# Patient Record
Sex: Male | Born: 1960 | Race: White | Hispanic: No | Smoking: Never smoker
Health system: Southern US, Community
[De-identification: ages and names within clinical notes are randomized; demographics above are authoritative.]

## PROBLEM LIST (undated history)

## (undated) DIAGNOSIS — F329 Major depressive disorder, single episode, unspecified: Secondary | ICD-10-CM

## (undated) DIAGNOSIS — K1230 Oral mucositis (ulcerative), unspecified: Secondary | ICD-10-CM

## (undated) DIAGNOSIS — Z9221 Personal history of antineoplastic chemotherapy: Secondary | ICD-10-CM

## (undated) DIAGNOSIS — C109 Malignant neoplasm of oropharynx, unspecified: Secondary | ICD-10-CM

## (undated) DIAGNOSIS — F32A Depression, unspecified: Secondary | ICD-10-CM

## (undated) DIAGNOSIS — E46 Unspecified protein-calorie malnutrition: Secondary | ICD-10-CM

## (undated) DIAGNOSIS — Z923 Personal history of irradiation: Secondary | ICD-10-CM

## (undated) HISTORY — DX: Oral mucositis (ulcerative), unspecified: K12.30

## (undated) HISTORY — DX: Unspecified protein-calorie malnutrition: E46

## (undated) HISTORY — DX: Depression, unspecified: F32.A

## (undated) HISTORY — PX: CHOLECYSTECTOMY: SHX55

## (undated) HISTORY — DX: Malignant neoplasm of oropharynx, unspecified: C10.9

## (undated) HISTORY — PX: APPENDECTOMY: SHX54

## (undated) HISTORY — DX: Major depressive disorder, single episode, unspecified: F32.9

---

## 2011-06-07 ENCOUNTER — Other Ambulatory Visit (HOSPITAL_COMMUNITY)
Admission: RE | Admit: 2011-06-07 | Discharge: 2011-06-07 | Disposition: A | Discharge status: Home / Self Care | Payer: Federal, State, Local not specified - PPO | Source: Ambulatory Visit | Attending: Otolaryngology | Admitting: Otolaryngology

## 2011-06-07 DIAGNOSIS — R220 Localized swelling, mass and lump, head: Secondary | ICD-10-CM | POA: Insufficient documentation

## 2011-06-07 DIAGNOSIS — R221 Localized swelling, mass and lump, neck: Secondary | ICD-10-CM | POA: Insufficient documentation

## 2011-06-13 ENCOUNTER — Other Ambulatory Visit (HOSPITAL_COMMUNITY): Payer: Self-pay | Admitting: Otolaryngology

## 2011-06-13 DIAGNOSIS — C029 Malignant neoplasm of tongue, unspecified: Secondary | ICD-10-CM

## 2011-06-15 ENCOUNTER — Ambulatory Visit
Admission: RE | Admit: 2011-06-15 | Discharge: 2011-06-15 | Disposition: A | Discharge status: Home / Self Care | Payer: Federal, State, Local not specified - PPO | Source: Ambulatory Visit | Attending: Otolaryngology | Admitting: Otolaryngology

## 2011-06-15 DIAGNOSIS — C029 Malignant neoplasm of tongue, unspecified: Secondary | ICD-10-CM

## 2011-06-15 MED ORDER — IOHEXOL 300 MG/ML  SOLN
75.0000 mL | Freq: Once | INTRAMUSCULAR | Status: AC | PRN
  Administered 2011-06-15: 75 mL via INTRAVENOUS

## 2011-06-23 ENCOUNTER — Other Ambulatory Visit (HOSPITAL_COMMUNITY): Payer: Federal, State, Local not specified - PPO

## 2011-06-23 ENCOUNTER — Encounter (HOSPITAL_COMMUNITY): Payer: Self-pay

## 2011-06-23 ENCOUNTER — Encounter (HOSPITAL_COMMUNITY)
Admission: RE | Admit: 2011-06-23 | Discharge: 2011-06-23 | Disposition: A | Discharge status: Home / Self Care | Payer: Federal, State, Local not specified - PPO | Source: Ambulatory Visit | Attending: Otolaryngology | Admitting: Otolaryngology

## 2011-06-23 DIAGNOSIS — C029 Malignant neoplasm of tongue, unspecified: Secondary | ICD-10-CM

## 2011-06-23 DIAGNOSIS — C01 Malignant neoplasm of base of tongue: Secondary | ICD-10-CM | POA: Insufficient documentation

## 2011-06-23 DIAGNOSIS — C770 Secondary and unspecified malignant neoplasm of lymph nodes of head, face and neck: Secondary | ICD-10-CM | POA: Insufficient documentation

## 2011-06-23 LAB — GLUCOSE, CAPILLARY: Glucose-Capillary: 103 mg/dL — ABNORMAL HIGH (ref 70–99)

## 2011-06-23 MED ORDER — FLUDEOXYGLUCOSE F - 18 (FDG) INJECTION
17.4000 | Freq: Once | INTRAVENOUS | Status: AC | PRN
  Administered 2011-06-23: 17.4 via INTRAVENOUS

## 2011-07-06 ENCOUNTER — Other Ambulatory Visit: Payer: Self-pay | Admitting: Oncology

## 2011-07-06 ENCOUNTER — Encounter (HOSPITAL_BASED_OUTPATIENT_CLINIC_OR_DEPARTMENT_OTHER): Payer: Federal, State, Local not specified - PPO | Admitting: Oncology

## 2011-07-06 ENCOUNTER — Ambulatory Visit
Admission: RE | Admit: 2011-07-06 | Discharge: 2011-07-06 | Disposition: A | Discharge status: Home / Self Care | Payer: Federal, State, Local not specified - PPO | Source: Ambulatory Visit | Attending: Radiation Oncology | Admitting: Radiation Oncology

## 2011-07-06 DIAGNOSIS — K0530 Chronic periodontitis, unspecified: Secondary | ICD-10-CM | POA: Insufficient documentation

## 2011-07-06 DIAGNOSIS — C01 Malignant neoplasm of base of tongue: Secondary | ICD-10-CM | POA: Insufficient documentation

## 2011-07-06 DIAGNOSIS — Z79899 Other long term (current) drug therapy: Secondary | ICD-10-CM | POA: Insufficient documentation

## 2011-07-06 DIAGNOSIS — K006 Disturbances in tooth eruption: Secondary | ICD-10-CM | POA: Insufficient documentation

## 2011-07-06 DIAGNOSIS — Z510 Encounter for antineoplastic radiation therapy: Secondary | ICD-10-CM | POA: Insufficient documentation

## 2011-07-06 LAB — CBC WITH DIFFERENTIAL/PLATELET
Basophils Absolute: 0 10*3/uL (ref 0.0–0.1)
EOS%: 1.7 % (ref 0.0–7.0)
Eosinophils Absolute: 0.1 10*3/uL (ref 0.0–0.5)
HCT: 43.2 % (ref 38.4–49.9)
HGB: 14.8 g/dL (ref 13.0–17.1)
MCH: 30.5 pg (ref 27.2–33.4)
NEUT#: 3.7 10*3/uL (ref 1.5–6.5)
NEUT%: 64.8 % (ref 39.0–75.0)
RDW: 12.6 % (ref 11.0–14.6)
lymph#: 1.4 10*3/uL (ref 0.9–3.3)

## 2011-07-06 LAB — COMPREHENSIVE METABOLIC PANEL
Albumin: 4.3 g/dL (ref 3.5–5.2)
BUN: 9 mg/dL (ref 6–23)
CO2: 26 mEq/L (ref 19–32)
Calcium: 9.5 mg/dL (ref 8.4–10.5)
Chloride: 106 mEq/L (ref 96–112)
Creatinine, Ser: 0.87 mg/dL (ref 0.50–1.35)
Glucose, Bld: 82 mg/dL (ref 70–99)
Potassium: 4.2 mEq/L (ref 3.5–5.3)

## 2011-07-07 ENCOUNTER — Encounter: Payer: Federal, State, Local not specified - PPO | Admitting: Oncology

## 2011-07-07 ENCOUNTER — Other Ambulatory Visit (HOSPITAL_COMMUNITY): Payer: Federal, State, Local not specified - PPO | Admitting: Dentistry

## 2011-07-07 ENCOUNTER — Other Ambulatory Visit: Payer: Self-pay | Admitting: Oncology

## 2011-07-07 DIAGNOSIS — C01 Malignant neoplasm of base of tongue: Secondary | ICD-10-CM

## 2011-07-07 DIAGNOSIS — Z0189 Encounter for other specified special examinations: Secondary | ICD-10-CM

## 2011-07-14 DIAGNOSIS — Z0189 Encounter for other specified special examinations: Secondary | ICD-10-CM

## 2011-07-14 DIAGNOSIS — K08409 Partial loss of teeth, unspecified cause, unspecified class: Secondary | ICD-10-CM

## 2011-07-14 DIAGNOSIS — K08109 Complete loss of teeth, unspecified cause, unspecified class: Secondary | ICD-10-CM

## 2011-07-18 ENCOUNTER — Encounter (HOSPITAL_BASED_OUTPATIENT_CLINIC_OR_DEPARTMENT_OTHER): Payer: Federal, State, Local not specified - PPO | Admitting: Oncology

## 2011-07-18 DIAGNOSIS — B977 Papillomavirus as the cause of diseases classified elsewhere: Secondary | ICD-10-CM

## 2011-07-18 DIAGNOSIS — C01 Malignant neoplasm of base of tongue: Secondary | ICD-10-CM

## 2011-07-20 ENCOUNTER — Ambulatory Visit (HOSPITAL_COMMUNITY)
Admission: RE | Admit: 2011-07-20 | Discharge: 2011-07-20 | Disposition: A | Discharge status: Home / Self Care | Payer: Federal, State, Local not specified - PPO | Source: Ambulatory Visit | Attending: Oncology | Admitting: Oncology

## 2011-07-20 ENCOUNTER — Ambulatory Visit (HOSPITAL_COMMUNITY): Payer: Federal, State, Local not specified - PPO

## 2011-07-20 DIAGNOSIS — Z79899 Other long term (current) drug therapy: Secondary | ICD-10-CM | POA: Insufficient documentation

## 2011-07-20 DIAGNOSIS — C029 Malignant neoplasm of tongue, unspecified: Secondary | ICD-10-CM | POA: Insufficient documentation

## 2011-07-20 DIAGNOSIS — C01 Malignant neoplasm of base of tongue: Secondary | ICD-10-CM

## 2011-07-20 MED ORDER — IOHEXOL 300 MG/ML  SOLN: 50.0000 mL | Freq: Once | INTRAMUSCULAR | Status: AC | PRN

## 2011-07-25 ENCOUNTER — Other Ambulatory Visit: Payer: Self-pay | Admitting: Oncology

## 2011-07-25 ENCOUNTER — Encounter (HOSPITAL_BASED_OUTPATIENT_CLINIC_OR_DEPARTMENT_OTHER): Payer: Federal, State, Local not specified - PPO | Admitting: Oncology

## 2011-07-25 DIAGNOSIS — Z5111 Encounter for antineoplastic chemotherapy: Secondary | ICD-10-CM

## 2011-07-25 DIAGNOSIS — C01 Malignant neoplasm of base of tongue: Secondary | ICD-10-CM

## 2011-07-25 DIAGNOSIS — B977 Papillomavirus as the cause of diseases classified elsewhere: Secondary | ICD-10-CM

## 2011-07-25 LAB — COMPREHENSIVE METABOLIC PANEL
AST: 19 U/L (ref 0–37)
Albumin: 4.3 g/dL (ref 3.5–5.2)
BUN: 11 mg/dL (ref 6–23)
CO2: 25 mEq/L (ref 19–32)
Calcium: 9.5 mg/dL (ref 8.4–10.5)
Chloride: 104 mEq/L (ref 96–112)
Potassium: 3.9 mEq/L (ref 3.5–5.3)

## 2011-07-25 LAB — CBC WITH DIFFERENTIAL/PLATELET
Basophils Absolute: 0 10*3/uL (ref 0.0–0.1)
EOS%: 2.1 % (ref 0.0–7.0)
Eosinophils Absolute: 0.1 10*3/uL (ref 0.0–0.5)
HCT: 41 % (ref 38.4–49.9)
HGB: 13.8 g/dL (ref 13.0–17.1)
MCH: 29.7 pg (ref 27.2–33.4)
MONO#: 0.5 10*3/uL (ref 0.1–0.9)
NEUT#: 4.4 10*3/uL (ref 1.5–6.5)
NEUT%: 69.7 % (ref 39.0–75.0)
RDW: 12.6 % (ref 11.0–14.6)
WBC: 6.3 10*3/uL (ref 4.0–10.3)
lymph#: 1.3 10*3/uL (ref 0.9–3.3)

## 2011-07-28 ENCOUNTER — Ambulatory Visit: Payer: Federal, State, Local not specified - PPO | Attending: Oncology

## 2011-07-28 DIAGNOSIS — IMO0001 Reserved for inherently not codable concepts without codable children: Secondary | ICD-10-CM | POA: Insufficient documentation

## 2011-07-28 DIAGNOSIS — C01 Malignant neoplasm of base of tongue: Secondary | ICD-10-CM | POA: Insufficient documentation

## 2011-07-29 ENCOUNTER — Other Ambulatory Visit: Payer: Self-pay | Admitting: Oncology

## 2011-07-29 ENCOUNTER — Encounter (HOSPITAL_BASED_OUTPATIENT_CLINIC_OR_DEPARTMENT_OTHER): Payer: Federal, State, Local not specified - PPO | Admitting: Oncology

## 2011-07-29 DIAGNOSIS — C01 Malignant neoplasm of base of tongue: Secondary | ICD-10-CM

## 2011-07-29 LAB — CBC WITH DIFFERENTIAL/PLATELET
BASO%: 0.1 % (ref 0.0–2.0)
Basophils Absolute: 0 10*3/uL (ref 0.0–0.1)
EOS%: 0.6 % (ref 0.0–7.0)
HGB: 14.2 g/dL (ref 13.0–17.1)
MCH: 30.2 pg (ref 27.2–33.4)
MCHC: 34 g/dL (ref 32.0–36.0)
MONO#: 0.4 10*3/uL (ref 0.1–0.9)
RDW: 12.5 % (ref 11.0–14.6)
WBC: 6.4 10*3/uL (ref 4.0–10.3)
lymph#: 0.9 10*3/uL (ref 0.9–3.3)

## 2011-07-29 LAB — BASIC METABOLIC PANEL
Chloride: 100 mEq/L (ref 96–112)
Potassium: 3.6 mEq/L (ref 3.5–5.3)

## 2011-08-01 ENCOUNTER — Encounter (HOSPITAL_BASED_OUTPATIENT_CLINIC_OR_DEPARTMENT_OTHER): Payer: Federal, State, Local not specified - PPO | Admitting: Oncology

## 2011-08-01 DIAGNOSIS — C01 Malignant neoplasm of base of tongue: Secondary | ICD-10-CM

## 2011-08-05 ENCOUNTER — Encounter: Payer: Federal, State, Local not specified - PPO | Admitting: Oncology

## 2011-08-05 ENCOUNTER — Other Ambulatory Visit: Payer: Self-pay | Admitting: Oncology

## 2011-08-05 LAB — BASIC METABOLIC PANEL
BUN: 12 mg/dL (ref 6–23)
Calcium: 9.4 mg/dL (ref 8.4–10.5)
Glucose, Bld: 91 mg/dL (ref 70–99)
Potassium: 4.3 mEq/L (ref 3.5–5.3)

## 2011-08-05 LAB — CBC WITH DIFFERENTIAL/PLATELET
Basophils Absolute: 0 10*3/uL (ref 0.0–0.1)
Eosinophils Absolute: 0.1 10*3/uL (ref 0.0–0.5)
HCT: 38.4 % (ref 38.4–49.9)
HGB: 13.2 g/dL (ref 13.0–17.1)
LYMPH%: 10.9 % — ABNORMAL LOW (ref 14.0–49.0)
MCV: 88.9 fL (ref 79.3–98.0)
MONO%: 12.8 % (ref 0.0–14.0)
NEUT#: 3.1 10*3/uL (ref 1.5–6.5)
NEUT%: 73.9 % (ref 39.0–75.0)
Platelets: 302 10*3/uL (ref 140–400)

## 2011-08-15 ENCOUNTER — Encounter (HOSPITAL_BASED_OUTPATIENT_CLINIC_OR_DEPARTMENT_OTHER): Payer: Federal, State, Local not specified - PPO | Admitting: Oncology

## 2011-08-15 ENCOUNTER — Other Ambulatory Visit: Payer: Self-pay | Admitting: Oncology

## 2011-08-15 DIAGNOSIS — B977 Papillomavirus as the cause of diseases classified elsewhere: Secondary | ICD-10-CM

## 2011-08-15 DIAGNOSIS — Z5111 Encounter for antineoplastic chemotherapy: Secondary | ICD-10-CM

## 2011-08-15 DIAGNOSIS — C01 Malignant neoplasm of base of tongue: Secondary | ICD-10-CM

## 2011-08-15 LAB — COMPREHENSIVE METABOLIC PANEL
ALT: 22 U/L (ref 0–53)
AST: 13 U/L (ref 0–37)
Alkaline Phosphatase: 112 U/L (ref 39–117)
Calcium: 9.5 mg/dL (ref 8.4–10.5)
Chloride: 101 mEq/L (ref 96–112)
Creatinine, Ser: 0.91 mg/dL (ref 0.50–1.35)
Total Bilirubin: 0.4 mg/dL (ref 0.3–1.2)

## 2011-08-15 LAB — CBC WITH DIFFERENTIAL/PLATELET
BASO%: 0.5 % (ref 0.0–2.0)
LYMPH%: 22.9 % (ref 14.0–49.0)
MCHC: 33.6 g/dL (ref 32.0–36.0)
MONO#: 0.3 10*3/uL (ref 0.1–0.9)
Platelets: 212 10*3/uL (ref 140–400)
RBC: 4.5 10*6/uL (ref 4.20–5.82)
WBC: 2.1 10*3/uL — ABNORMAL LOW (ref 4.0–10.3)
nRBC: 0 % (ref 0–0)

## 2011-08-16 ENCOUNTER — Emergency Department (HOSPITAL_COMMUNITY): Payer: Federal, State, Local not specified - PPO

## 2011-08-16 ENCOUNTER — Inpatient Hospital Stay (HOSPITAL_COMMUNITY)
Admission: EM | Admit: 2011-08-16 | Discharge: 2011-08-21 | DRG: 552 | Disposition: A | Discharge status: Home Health | Payer: Federal, State, Local not specified - PPO | Attending: Internal Medicine | Admitting: Internal Medicine

## 2011-08-16 DIAGNOSIS — F329 Major depressive disorder, single episode, unspecified: Secondary | ICD-10-CM | POA: Diagnosis present

## 2011-08-16 DIAGNOSIS — K922 Gastrointestinal hemorrhage, unspecified: Principal | ICD-10-CM | POA: Diagnosis present

## 2011-08-16 DIAGNOSIS — T451X5A Adverse effect of antineoplastic and immunosuppressive drugs, initial encounter: Secondary | ICD-10-CM | POA: Diagnosis present

## 2011-08-16 DIAGNOSIS — K117 Disturbances of salivary secretion: Secondary | ICD-10-CM | POA: Diagnosis present

## 2011-08-16 DIAGNOSIS — D72819 Decreased white blood cell count, unspecified: Secondary | ICD-10-CM | POA: Diagnosis present

## 2011-08-16 DIAGNOSIS — F3289 Other specified depressive episodes: Secondary | ICD-10-CM | POA: Diagnosis present

## 2011-08-16 DIAGNOSIS — Z931 Gastrostomy status: Secondary | ICD-10-CM

## 2011-08-16 DIAGNOSIS — C109 Malignant neoplasm of oropharynx, unspecified: Secondary | ICD-10-CM | POA: Diagnosis present

## 2011-08-16 DIAGNOSIS — Y842 Radiological procedure and radiotherapy as the cause of abnormal reaction of the patient, or of later complication, without mention of misadventure at the time of the procedure: Secondary | ICD-10-CM | POA: Diagnosis present

## 2011-08-16 DIAGNOSIS — K1231 Oral mucositis (ulcerative) due to antineoplastic therapy: Secondary | ICD-10-CM | POA: Diagnosis present

## 2011-08-16 DIAGNOSIS — E43 Unspecified severe protein-calorie malnutrition: Secondary | ICD-10-CM | POA: Diagnosis present

## 2011-08-16 DIAGNOSIS — E876 Hypokalemia: Secondary | ICD-10-CM | POA: Diagnosis present

## 2011-08-16 DIAGNOSIS — R7989 Other specified abnormal findings of blood chemistry: Secondary | ICD-10-CM | POA: Diagnosis present

## 2011-08-16 DIAGNOSIS — R112 Nausea with vomiting, unspecified: Secondary | ICD-10-CM | POA: Diagnosis present

## 2011-08-16 DIAGNOSIS — D61810 Antineoplastic chemotherapy induced pancytopenia: Secondary | ICD-10-CM | POA: Diagnosis present

## 2011-08-16 LAB — CBC
HCT: 40.8 % (ref 39.0–52.0)
MCHC: 33.6 g/dL (ref 30.0–36.0)
MCV: 88.9 fL (ref 78.0–100.0)
RDW: 12.7 % (ref 11.5–15.5)

## 2011-08-16 LAB — COMPREHENSIVE METABOLIC PANEL
ALT: 148 U/L — ABNORMAL HIGH (ref 0–53)
Alkaline Phosphatase: 124 U/L — ABNORMAL HIGH (ref 39–117)
BUN: 14 mg/dL (ref 6–23)
CO2: 28 mEq/L (ref 19–32)
Calcium: 9.4 mg/dL (ref 8.4–10.5)
GFR calc Af Amer: >60 mL/min (ref >60)
GFR calc non Af Amer: >60 mL/min (ref >60)
Glucose, Bld: 108 mg/dL — ABNORMAL HIGH (ref 70–99)
Total Protein: 7.1 g/dL (ref 6.0–8.3)

## 2011-08-16 LAB — URINALYSIS, ROUTINE W REFLEX MICROSCOPIC
Bilirubin Urine: NEGATIVE
Hgb urine dipstick: NEGATIVE
Ketones, ur: NEGATIVE mg/dL
Nitrite: NEGATIVE
Specific Gravity, Urine: 1.016 (ref 1.005–1.030)
pH: 6 (ref 5.0–8.0)

## 2011-08-16 LAB — DIFFERENTIAL
Eosinophils Relative: 2 % (ref 0–5)
Lymphocytes Relative: 5 % — ABNORMAL LOW (ref 12–46)
Lymphs Abs: 0.2 10*3/uL — ABNORMAL LOW (ref 0.7–4.0)
Monocytes Absolute: 0.4 10*3/uL (ref 0.1–1.0)
Monocytes Relative: 9 % (ref 3–12)

## 2011-08-16 LAB — HEMOGLOBIN AND HEMATOCRIT, BLOOD: HCT: 37.8 % — ABNORMAL LOW (ref 39.0–52.0)

## 2011-08-16 LAB — OCCULT BLOOD, POC DEVICE: Fecal Occult Bld: NEGATIVE

## 2011-08-16 LAB — ABO/RH: ABO/RH(D): O POS

## 2011-08-16 LAB — LIPASE, BLOOD: Lipase: 16 U/L (ref 11–59)

## 2011-08-16 LAB — SAMPLE TO BLOOD BANK

## 2011-08-17 ENCOUNTER — Ambulatory Visit (HOSPITAL_COMMUNITY): Payer: Self-pay | Admitting: Dentistry

## 2011-08-17 DIAGNOSIS — R1110 Vomiting, unspecified: Secondary | ICD-10-CM

## 2011-08-17 DIAGNOSIS — R112 Nausea with vomiting, unspecified: Secondary | ICD-10-CM

## 2011-08-17 DIAGNOSIS — K920 Hematemesis: Secondary | ICD-10-CM

## 2011-08-17 DIAGNOSIS — R1013 Epigastric pain: Secondary | ICD-10-CM

## 2011-08-17 LAB — COMPREHENSIVE METABOLIC PANEL
ALT: 158 U/L — ABNORMAL HIGH (ref 0–53)
AST: 89 U/L — ABNORMAL HIGH (ref 0–37)
CO2: 27 mEq/L (ref 19–32)
Calcium: 8.2 mg/dL — ABNORMAL LOW (ref 8.4–10.5)
Creatinine, Ser: 1 mg/dL (ref 0.50–1.35)
GFR calc non Af Amer: >60 mL/min (ref >60)
Sodium: 139 mEq/L (ref 135–145)
Total Protein: 5.5 g/dL — ABNORMAL LOW (ref 6.0–8.3)

## 2011-08-17 LAB — CBC
MCH: 29.8 pg (ref 26.0–34.0)
MCHC: 33.2 g/dL (ref 30.0–36.0)
MCV: 89.6 fL (ref 78.0–100.0)
Platelets: 159 10*3/uL (ref 150–400)
RBC: 3.93 MIL/uL — ABNORMAL LOW (ref 4.22–5.81)
RDW: 12.9 % (ref 11.5–15.5)

## 2011-08-17 LAB — HEMOGLOBIN AND HEMATOCRIT, BLOOD: HCT: 36.6 % — ABNORMAL LOW (ref 39.0–52.0)

## 2011-08-17 NOTE — H&P (Signed)
[name], [name]                  ACCOUNT NO.:  [id number]  MEDICAL RECORD NO.:  [id number]  LOCATION:  1224                         FACILITY:  [facility]  PHYSICIAN:  Benson Setting, MD    DATE OF BIRTH:  [birth year]  DATE OF ADMISSION:  08/16/2011 DATE OF DISCHARGE:                             HISTORY & PHYSICAL   REASON FOR HOSPITAL VISIT:  Coffee-ground vomiting.  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 Jethro Bolus, last chemo was yesterday.  Apparently, the patient woke up this morning, nauseated, and threw up some coffee-ground material.  He has had 4 to 5 such episodes since this morning, came to ER few hours ago, had one more episode of emesis, which has since resolved after receiving IV Zofran.  Currently, he is symptom-free.  Hemodynamics and H and H are stable.  I was called to admit the patient.  PAST MEDICAL AND SURGICAL HISTORY:  Patient really has no other medical or surgical problems except recent diagnosis of laryngeal cancer. Patient is on cisplatin chemo.  SOCIAL HISTORY:  No history of smoking or alcohol.  FAMILY HISTORY:  Negative for laryngeal or stomach cancers.  Grandmother had colon cancer.  HOME MEDICATIONS:  Patient is having cisplatin chemo, also taking Cymbalta, dose unclear.  No allergies.  REVIEW OF SYSTEMS:  In my interview, patient denies any headache, denies any nausea at this time, emesis as above.  No new problems of vision or hearing.  No problems of swallowing food.  He is receiving most of his diet through his PEG tube, which was placed by Interventional Radiology. Denies any chest pain, palpitations, cough, phlegm, or shortness of breath.  No abdominal pain, whatsoever.  He has been having some diarrhea since his chemotherapy yesterday, says it appears dark also. No weakness, tingling, or  numbness in the extremity.  He is not suicidal or homicidal.  Full 10-point review of systems obtained except as dictated above, all other review of systems negative.  PHYSICAL EXAMINATION:  VITAL SIGNS:  Temperature 98.4, pulse 75, respirations 16, blood pressure 116/78.  He is 99% on room air. GENERAL:  Young, well-built Caucasian gentleman sitting in hospital bed, in no apparent discomfort. HEENT:  Normocephalic, atraumatic head.  Pupils are equal, round, and reactive to light.  Pink and moist, tongue and throat.  No scleral icterus. NECK:  Supple. CNS:  All cranial nerves intact.  No focal neurological deficits. PSYCH:  Insight is intact.  Not suicidal or homicidal. CHEST:  Wall movement bilaterally symmetrical.  Good air movement bilaterally.  No rhonchi.  No wheezes. CVS:  Regular rate rhythm.  Normal S1, S2.  No gallops or murmurs. ABDOMEN:  Soft.  Positive bowel sounds.  Nontender.  He has a PEG tube with slight amount of dark material in it, appears to be old and dry. ABDOMEN:  Soft and nontender. SKIN:  No cyanosis or bruises.  No petechiae. MUSCULOSKELETAL:  Muscle tone normal.  No digital clubbing.  LABORATORY DATA:  White count 4.3, hemoglobin 13.7, hematocrit 40.8, platelets 198,  neutrophil count 84%.  PTT 30, INR 1.04, PT 13.8.  Sodium 138, potassium 3.7, chloride 103, bicarb 28, glucose 108, BUN 14, creatinine 0.8, alk phos 124.  AST 93, ALT 148, total bilirubin 0.9, lipase 16.  Hemoccult blood negative.  KUB and chest x-ray unremarkable.  ASSESSMENT AND PLAN: 1. Upper gastrointestinal bleed, could be nausea and vomiting     secondary to chemotherapy with small Mallory-Weiss tear versus     peptic ulcer disease.  Plan is to admit the patient.  I have     discussed this case with Dr. Russella Dar, GI and Dr. Donnie Coffin, Oncology, who     have told me that the patient will be seen by their services     tomorrow.  For now, I will admit the patient to Step-Down Unit.      Frequent H and H monitor q.4 hours with holding of 2 units of     packed RBC.  We will try to keep him above 8.5 since his baseline     hemoglobin seems to be decent, we will try to avoid large fall in a     short period of time.  We will put him n.p.o. with IV PPI bolus     followed by IV PPI drip.  GI and Hem/Onc to see in the morning.  No     isolation needed per Dr. Donnie Coffin. 2. History of laryngeal cancer.  Hem/Onc to see in the morning as     dictated above, I have discussed the case with Dr. Donnie Coffin,     outpatient chemoradiation, etc. 3. Sequential compression devices for deep vein thrombosis prophylaxis     on patient on IV PPI.  Patient is full code.          ______________________________ Benson Setting, MD     PS/MEDQ  D:  08/16/2011  T:  08/17/2011  Job:  [id number]  Electronically Signed by Susa Raring MD on 08/17/2011 07:10:57 PM

## 2011-08-18 LAB — COMPREHENSIVE METABOLIC PANEL
ALT: 177 U/L — ABNORMAL HIGH (ref 0–53)
AST: 66 U/L — ABNORMAL HIGH (ref 0–37)
CO2: 30 mEq/L (ref 19–32)
Chloride: 100 mEq/L (ref 96–112)
GFR calc non Af Amer: >60 mL/min (ref >60)
Glucose, Bld: 106 mg/dL — ABNORMAL HIGH (ref 70–99)
Sodium: 137 mEq/L (ref 135–145)
Total Bilirubin: 0.7 mg/dL (ref 0.3–1.2)

## 2011-08-18 LAB — DIFFERENTIAL
Basophils Relative: 0 % (ref 0–1)
Eosinophils Absolute: 0.3 10*3/uL (ref 0.0–0.7)
Lymphs Abs: 0.2 10*3/uL — ABNORMAL LOW (ref 0.7–4.0)
Neutro Abs: 1.9 10*3/uL (ref 1.7–7.7)
Neutrophils Relative %: 65 % (ref 43–77)

## 2011-08-18 LAB — CBC
Hemoglobin: 12.7 g/dL — ABNORMAL LOW (ref 13.0–17.0)
MCV: 88.8 fL (ref 78.0–100.0)
Platelets: 154 10*3/uL (ref 150–400)
RBC: 4.28 MIL/uL (ref 4.22–5.81)
WBC: 2.9 10*3/uL — ABNORMAL LOW (ref 4.0–10.5)

## 2011-08-18 LAB — CROSSMATCH
ABO/RH(D): O POS
Unit division: 0

## 2011-08-19 LAB — BASIC METABOLIC PANEL
Calcium: 8.8 mg/dL (ref 8.4–10.5)
GFR calc Af Amer: >60 mL/min (ref >60)
GFR calc non Af Amer: >60 mL/min (ref >60)
Sodium: 139 mEq/L (ref 135–145)

## 2011-08-19 LAB — CBC
MCH: 29.7 pg (ref 26.0–34.0)
MCHC: 33.9 g/dL (ref 30.0–36.0)
Platelets: 148 10*3/uL — ABNORMAL LOW (ref 150–400)
RDW: 12.6 % (ref 11.5–15.5)

## 2011-08-20 LAB — CBC
MCH: 29.4 pg (ref 26.0–34.0)
Platelets: 143 10*3/uL — ABNORMAL LOW (ref 150–400)
RBC: 4.18 MIL/uL — ABNORMAL LOW (ref 4.22–5.81)
WBC: 3 10*3/uL — ABNORMAL LOW (ref 4.0–10.5)

## 2011-08-20 LAB — BASIC METABOLIC PANEL
CO2: 30 mEq/L (ref 19–32)
Calcium: 8.5 mg/dL (ref 8.4–10.5)
Sodium: 139 mEq/L (ref 135–145)

## 2011-08-20 LAB — MAGNESIUM: Magnesium: 1.7 mg/dL (ref 1.5–2.5)

## 2011-08-23 ENCOUNTER — Encounter: Payer: Medicaid - Dental | Admitting: Oncology

## 2011-08-24 ENCOUNTER — Encounter: Payer: Medicaid - Dental | Admitting: Oncology

## 2011-08-26 ENCOUNTER — Encounter (HOSPITAL_BASED_OUTPATIENT_CLINIC_OR_DEPARTMENT_OTHER): Payer: Federal, State, Local not specified - PPO | Admitting: Oncology

## 2011-08-26 ENCOUNTER — Other Ambulatory Visit: Payer: Self-pay | Admitting: Oncology

## 2011-08-26 DIAGNOSIS — C01 Malignant neoplasm of base of tongue: Secondary | ICD-10-CM

## 2011-08-26 LAB — BASIC METABOLIC PANEL
CO2: 31 mEq/L (ref 19–32)
Chloride: 100 mEq/L (ref 96–112)
Creatinine, Ser: 0.9 mg/dL (ref 0.50–1.35)
Glucose, Bld: 85 mg/dL (ref 70–99)

## 2011-08-27 DIAGNOSIS — Z9221 Personal history of antineoplastic chemotherapy: Secondary | ICD-10-CM

## 2011-08-27 HISTORY — DX: Personal history of antineoplastic chemotherapy: Z92.21

## 2011-09-02 ENCOUNTER — Other Ambulatory Visit: Payer: Self-pay | Admitting: Oncology

## 2011-09-02 ENCOUNTER — Encounter (HOSPITAL_BASED_OUTPATIENT_CLINIC_OR_DEPARTMENT_OTHER): Payer: Medicaid - Dental | Admitting: Oncology

## 2011-09-02 DIAGNOSIS — C01 Malignant neoplasm of base of tongue: Secondary | ICD-10-CM

## 2011-09-02 LAB — CBC WITH DIFFERENTIAL/PLATELET
Basophils Absolute: 0 10*3/uL (ref 0.0–0.1)
Eosinophils Absolute: 0 10*3/uL (ref 0.0–0.5)
HGB: 13 g/dL (ref 13.0–17.1)
LYMPH%: 16 % (ref 14.0–49.0)
MCV: 88.6 fL (ref 79.3–98.0)
MONO%: 17.8 % — ABNORMAL HIGH (ref 0.0–14.0)
NEUT#: 1 10*3/uL — ABNORMAL LOW (ref 1.5–6.5)
Platelets: 288 10*3/uL (ref 140–400)
RBC: 4.21 10*6/uL (ref 4.20–5.82)

## 2011-09-02 LAB — COMPREHENSIVE METABOLIC PANEL
Alkaline Phosphatase: 118 U/L — ABNORMAL HIGH (ref 39–117)
BUN: 18 mg/dL (ref 6–23)
Creatinine, Ser: 0.91 mg/dL (ref 0.50–1.35)
Glucose, Bld: 84 mg/dL (ref 70–99)
Total Bilirubin: 0.5 mg/dL (ref 0.3–1.2)

## 2011-09-05 ENCOUNTER — Encounter: Payer: Federal, State, Local not specified - PPO | Admitting: Oncology

## 2011-09-13 NOTE — Discharge Summary (Signed)
NAMEJERRID, [name]                  ACCOUNT NO.:  [id number]  MEDICAL RECORD NO.:  [id number]  LOCATION:  1336                         FACILITY:  [facility]  PHYSICIAN:  Marcellus Scott, MD     DATE OF BIRTH:  1961-09-27  DATE OF ADMISSION:  08/16/2011 DATE OF DISCHARGE:  08/21/2011                        DISCHARGE SUMMARY - REFERRING   PRIMARY ONCOLOGIST:  Dr. Jethro Bolus.  RADIATION ONCOLOGIST:  Dr. Lonie Peak  DISCHARGE DIAGNOSES: 1. Mild upper gastrointestinal bleeding.  Resolved. 2. Nausea and vomiting secondary to chemoradiation. 3. Stage IV-A oropharyngeal squamous cell carcinoma, on     chemoradiation. 4. Mucositis secondary to chemoradiation. 5. Depression. 6. Abnormal liver function tests.  Question secondary to cisplatin.     Outpatient evaluation as deemed necessary. 7. Hypokalemia, repleted. 8. Pancytopenia secondary to chemotherapy. 9. Severe protein-calorie malnutrition, changed to continuous tube     feeding.  DISCHARGE MEDICATIONS: 1. Gluten-free 1.5 calories nutritional supplement with fiber (vital)     25 mL per hour via tube continuously.  This is to be increased     gradually to goal of 75 mL per hour. 2. Free water 240 mL via tube q.4 h. 3. Hydrogen peroxide 3% solution, 5 mL gargle and spit q. 6 h. p.r.n.     for thick phlegm. 4. Magic mouth wash 15 mL, swish or gargle and spit q. 6 h. p.r.n. for     sore throat or mouth. 5. Protonix 40 mg p.o. b.i.d. 6. Compazine 10 mg p.o. q. 6 h. p.r.n. nausea. 7. Cymbalta 20 mg p.o. daily. 8. Lorazepam 0.5 mg p.o. q. 6 h. p.r.n. for nausea or anxiety. 9. Ondansetron ODT 8 mg p.o. q. 12 h. p.r.n. for nausea.  IMAGING: 1. Abdominal x-ray on August 21, postoperative changes.  No acute     findings or obstruction. 2. Chest x-ray.  No acute findings.  LAB DATA:  Magnesium 1.7.  Basic metabolic panel within normal limits. CBC on August 25th; hemoglobin 12.3, hematocrit 36.6, white blood cell 3, and platelets 143.   Hepatic panel on August 23rd, showed AST 56, ALT 177, total protein 5.9, albumin 3.  Fecal occult blood was negative. Lipase was 16.  Coagulation indices within normal.  CONSULTATIONS: 1. Gastroenterology, Dr. Yancey Flemings. 2. Oncology, Dr. Jethro Bolus.  DIET:  P.o. soft diet as tolerated.  Continuous tube feeds via PEG tube as per instructions above.  ACTIVITY:  Ad-lib.  TODAY'S COMPLAINTS:  The patient indicated that he had a small emesis last night, but according to Nursing, it was not tube feeds and was some upper airway mucus.  He denies abdominal pain.  PHYSICAL EXAMINATION:  GENERAL:  The patient is in no obvious distress. VITAL SIGNS:  Temperature 98.2 degrees Fahrenheit, pulse 63 per minute, respiration 19 per minute, blood pressure 102/65 mmHg, and saturating at 98% on room air. RESPIRATORY SYSTEM:  Clear. CARDIOVASCULAR SYSTEM:  First and second heart sounds heard regular. ABDOMEN:  Nondistended, soft, and normal bowel sounds heard. CENTRAL NERVOUS SYSTEM:  The patient is awake, alert, oriented x3 with no focal neurological deficits.  HOSPITAL COURSE:  [name] is a 50 year old Caucasian male patient with history of stage IV-A  oropharyngeal squamous cell carcinoma on concurrent chemoradiation.  He presented with multiple episodes of coffee-ground emesis on August 21st.  He was thereby admitted for further evaluation and management.  PROBLEM LIST: 1. Mild self-limiting upper gastrointestinal bleed.  The differential     diagnosis were secondary to Mallory-Weiss tear from vomiting and     retching versus retching gastropathy.  The patient was admitted     initially to step-down unit.  His H and Hs were closely monitored.     He was placed on Protonix drip.  Gastroenterology was consulted.     They did not perform any procedures and recommended continuing his     proton pump inhibitors.  He has had no further episodes of upper     gastrointestinal bleed. 2. Nausea and  vomiting.  This is likely secondary to his     chemoradiation.  The patient was started on his diet, but had     episodes of nausea and vomiting.  His medications were adjusted,     but he continued to have some episodes of nausea and vomiting.  His     tube feeds were changed to low-volume continuous tube feeds.  He     has tolerated the same with occasional nausea.  According to the     oncologist, the patient will continue to have some degree of nausea     and vomiting secondary to his chemoradiation.  He can be discharged     on his tube feeding to gradually increase to goal as tolerated.     The patient is agreeable to this plan. 3. Mucositis and xerostomia secondary to chemoradiation.  Management     as above.  This seems to be improving. 4. Abnormal liver function tests.  Possibly secondary to cisplatin.     These can be followed up and evaluated as an outpatient as deemed     necessary. 5. Pancytopenia secondary to chemoradiation.  Outpatient followup with     Oncology. 6. Depression.  Today the patient looks the best he has looked      since admission.  He is cheerful.  He has denied any suicidal or     homicidal ideations.  He can continue his Cymbalta. 7. Hypokalemia.  Repleted. 8. Severe protein-calorie malnutrition.  Tube feeds as indicated     above.  DISPOSITION:  The patient will be discharged home in stable condition.  FOLLOWUP RECOMMENDATIONS.: 1. With Dr. Jethro Bolus.  The patient is to call for an appointment. 2. With Dr. Lonie Peak.  The patient is to call for an appointment.  Time taken in coordinating this discharge is 35 minutes.  Home health skilled nursing is arranged for assistance with tube feeds.     Marcellus Scott, MD     AH/MEDQ  D:  08/21/2011  T:  08/21/2011  Job:  [id number]  cc:   Exie Parody, M.D.  Grayland Jack, M.D. Fax: [phone]  Electronically Signed by Marcellus Scott MD on 09/13/2011 01:04:42 AM

## 2011-09-19 ENCOUNTER — Encounter (HOSPITAL_BASED_OUTPATIENT_CLINIC_OR_DEPARTMENT_OTHER): Payer: Federal, State, Local not specified - PPO | Admitting: Oncology

## 2011-09-19 ENCOUNTER — Other Ambulatory Visit: Payer: Self-pay | Admitting: Oncology

## 2011-09-19 DIAGNOSIS — Z5111 Encounter for antineoplastic chemotherapy: Secondary | ICD-10-CM

## 2011-09-19 DIAGNOSIS — B977 Papillomavirus as the cause of diseases classified elsewhere: Secondary | ICD-10-CM

## 2011-09-19 DIAGNOSIS — C01 Malignant neoplasm of base of tongue: Secondary | ICD-10-CM

## 2011-09-19 DIAGNOSIS — C109 Malignant neoplasm of oropharynx, unspecified: Secondary | ICD-10-CM

## 2011-09-19 LAB — CBC WITH DIFFERENTIAL/PLATELET
BASO%: 0.1 % (ref 0.0–2.0)
LYMPH%: 7.3 % — ABNORMAL LOW (ref 14.0–49.0)
MCHC: 35.2 g/dL (ref 32.0–36.0)
MONO#: 0.4 10*3/uL (ref 0.1–0.9)
RBC: 3.87 10*6/uL — ABNORMAL LOW (ref 4.20–5.82)
WBC: 2.6 10*3/uL — ABNORMAL LOW (ref 4.0–10.3)
lymph#: 0.2 10*3/uL — ABNORMAL LOW (ref 0.9–3.3)

## 2011-09-19 LAB — BASIC METABOLIC PANEL
CO2: 31 mEq/L (ref 19–32)
Calcium: 9 mg/dL (ref 8.4–10.5)
Chloride: 101 mEq/L (ref 96–112)
Sodium: 141 mEq/L (ref 135–145)

## 2011-10-09 ENCOUNTER — Encounter: Payer: Self-pay | Admitting: Oncology

## 2011-10-10 ENCOUNTER — Ambulatory Visit
Admission: RE | Admit: 2011-10-10 | Discharge: 2011-10-10 | Disposition: A | Discharge status: Home / Self Care | Payer: Federal, State, Local not specified - PPO | Source: Ambulatory Visit | Attending: Radiation Oncology | Admitting: Radiation Oncology

## 2011-10-10 ENCOUNTER — Ambulatory Visit: Payer: Federal, State, Local not specified - PPO | Attending: Oncology

## 2011-10-10 ENCOUNTER — Other Ambulatory Visit: Payer: Self-pay | Admitting: Oncology

## 2011-10-10 ENCOUNTER — Encounter (HOSPITAL_BASED_OUTPATIENT_CLINIC_OR_DEPARTMENT_OTHER): Payer: Federal, State, Local not specified - PPO | Admitting: Oncology

## 2011-10-10 DIAGNOSIS — C01 Malignant neoplasm of base of tongue: Secondary | ICD-10-CM

## 2011-10-10 DIAGNOSIS — C029 Malignant neoplasm of tongue, unspecified: Secondary | ICD-10-CM

## 2011-10-10 DIAGNOSIS — F3289 Other specified depressive episodes: Secondary | ICD-10-CM

## 2011-10-10 DIAGNOSIS — IMO0001 Reserved for inherently not codable concepts without codable children: Secondary | ICD-10-CM | POA: Insufficient documentation

## 2011-10-10 DIAGNOSIS — Z5111 Encounter for antineoplastic chemotherapy: Secondary | ICD-10-CM

## 2011-10-10 DIAGNOSIS — F329 Major depressive disorder, single episode, unspecified: Secondary | ICD-10-CM

## 2011-10-10 DIAGNOSIS — B977 Papillomavirus as the cause of diseases classified elsewhere: Secondary | ICD-10-CM

## 2011-10-10 LAB — COMPREHENSIVE METABOLIC PANEL
ALT: 74 U/L — ABNORMAL HIGH (ref 0–53)
Albumin: 4.2 g/dL (ref 3.5–5.2)
Alkaline Phosphatase: 101 U/L (ref 39–117)
Potassium: 4.2 mEq/L (ref 3.5–5.3)
Sodium: 141 mEq/L (ref 135–145)
Total Bilirubin: 0.3 mg/dL (ref 0.3–1.2)
Total Protein: 6.4 g/dL (ref 6.0–8.3)

## 2011-10-10 LAB — CBC WITH DIFFERENTIAL/PLATELET
BASO%: 0.1 % (ref 0.0–2.0)
LYMPH%: 8.5 % — ABNORMAL LOW (ref 14.0–49.0)
MCHC: 34.4 g/dL (ref 32.0–36.0)
MCV: 93.2 fL (ref 79.3–98.0)
MONO#: 0.4 10*3/uL (ref 0.1–0.9)
MONO%: 10.6 % (ref 0.0–14.0)
NEUT#: 3.1 10*3/uL (ref 1.5–6.5)
Platelets: 274 10*3/uL (ref 140–400)
RBC: 3.86 10*6/uL — ABNORMAL LOW (ref 4.20–5.82)
RDW: 16.5 % — ABNORMAL HIGH (ref 11.0–14.6)

## 2011-10-11 ENCOUNTER — Other Ambulatory Visit (HOSPITAL_COMMUNITY): Payer: Medicaid - Dental | Admitting: Dentistry

## 2011-10-11 DIAGNOSIS — R1310 Dysphagia, unspecified: Secondary | ICD-10-CM

## 2011-10-11 DIAGNOSIS — K1230 Oral mucositis (ulcerative), unspecified: Secondary | ICD-10-CM

## 2011-10-11 DIAGNOSIS — K121 Other forms of stomatitis: Secondary | ICD-10-CM

## 2011-10-11 DIAGNOSIS — K117 Disturbances of salivary secretion: Secondary | ICD-10-CM

## 2011-11-04 ENCOUNTER — Ambulatory Visit: Payer: Federal, State, Local not specified - PPO | Attending: Oncology

## 2011-11-04 DIAGNOSIS — IMO0001 Reserved for inherently not codable concepts without codable children: Secondary | ICD-10-CM | POA: Insufficient documentation

## 2011-11-04 DIAGNOSIS — C01 Malignant neoplasm of base of tongue: Secondary | ICD-10-CM | POA: Insufficient documentation

## 2011-11-25 ENCOUNTER — Telehealth: Payer: Self-pay | Admitting: *Deleted

## 2011-11-25 NOTE — Telephone Encounter (Signed)
Please write what he asked you to do on a script.  I'll sign the script.  No letter is needed.

## 2011-11-25 NOTE — Telephone Encounter (Signed)
Pt called back and states he just needs a "Generic Letter"  Stating simply that he is cleared from Dr. 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   He requests letter be faxed to his job at [employer], attention [employer] at fax [phone].

## 2011-11-25 NOTE — Telephone Encounter (Signed)
Pt left VM requesting letter from Dr. Gaylyn Rong to return to work part time and light duty.  I called pt back and left VM asking for more specific details of what letter needs to say.  Requested he call back again.

## 2011-11-28 ENCOUNTER — Encounter: Payer: Self-pay | Admitting: *Deleted

## 2011-11-28 NOTE — Progress Notes (Signed)
Faxed Rx signed by Dr. Gaylyn Rong; pt "May resume work, light-duty, 4 hrs per day"   To Fax [phone],   Att: Ashley Akin.

## 2011-11-30 ENCOUNTER — Encounter: Payer: Self-pay | Admitting: *Deleted

## 2011-11-30 NOTE — Progress Notes (Signed)
[facility] on [address] Faxed prior authorization request for pantoprazole.  Request to Managed Care.

## 2011-12-02 ENCOUNTER — Encounter: Payer: Self-pay | Admitting: *Deleted

## 2011-12-05 ENCOUNTER — Encounter: Payer: Self-pay | Admitting: Oncology

## 2011-12-05 DIAGNOSIS — F32A Depression, unspecified: Secondary | ICD-10-CM | POA: Insufficient documentation

## 2011-12-05 DIAGNOSIS — E46 Unspecified protein-calorie malnutrition: Secondary | ICD-10-CM | POA: Insufficient documentation

## 2011-12-05 DIAGNOSIS — F329 Major depressive disorder, single episode, unspecified: Secondary | ICD-10-CM | POA: Insufficient documentation

## 2011-12-06 ENCOUNTER — Encounter: Payer: Self-pay | Admitting: *Deleted

## 2011-12-06 NOTE — Progress Notes (Signed)
RECEIVED A FAX FROM [facility] CONCERNING A PRIOR AUTHORIZATION FOR PANTOPRAZOLE. THIS REQUEST WAS PLACED IN THE MANAGED CARE BIN. 

## 2011-12-06 NOTE — Progress Notes (Signed)
Received call from pt stating that he still has not been able to get his protonix due to prior authorization needed from insurance. Informed pt that prior auths are handled in our managed care department. Will notify Lanora Manis in managed care. Pt call back # is [phone]

## 2011-12-08 ENCOUNTER — Telehealth: Payer: Self-pay | Admitting: Oncology

## 2011-12-08 NOTE — Telephone Encounter (Signed)
Called [phone], pantoprazole 40mg  has been approved from 10/06/11-12/05/12

## 2011-12-13 ENCOUNTER — Encounter (HOSPITAL_COMMUNITY)
Admission: RE | Admit: 2011-12-13 | Discharge: 2011-12-13 | Disposition: A | Discharge status: Home / Self Care | Payer: Federal, State, Local not specified - PPO | Source: Ambulatory Visit | Attending: Oncology | Admitting: Oncology

## 2011-12-13 ENCOUNTER — Encounter (HOSPITAL_COMMUNITY): Payer: Self-pay

## 2011-12-13 DIAGNOSIS — C029 Malignant neoplasm of tongue, unspecified: Secondary | ICD-10-CM

## 2011-12-13 LAB — GLUCOSE, CAPILLARY: Glucose-Capillary: 94 mg/dL (ref 70–99)

## 2011-12-13 MED ORDER — FLUDEOXYGLUCOSE F - 18 (FDG) INJECTION
16.5000 | Freq: Once | INTRAVENOUS | Status: AC | PRN
  Administered 2011-12-13: 16.5 via INTRAVENOUS

## 2011-12-14 ENCOUNTER — Telehealth: Payer: Self-pay | Admitting: Oncology

## 2011-12-14 ENCOUNTER — Other Ambulatory Visit (HOSPITAL_BASED_OUTPATIENT_CLINIC_OR_DEPARTMENT_OTHER): Payer: Federal, State, Local not specified - PPO | Admitting: Lab

## 2011-12-14 ENCOUNTER — Other Ambulatory Visit: Payer: Self-pay | Admitting: Oncology

## 2011-12-14 ENCOUNTER — Ambulatory Visit (HOSPITAL_BASED_OUTPATIENT_CLINIC_OR_DEPARTMENT_OTHER): Payer: Federal, State, Local not specified - PPO | Admitting: Oncology

## 2011-12-14 ENCOUNTER — Other Ambulatory Visit: Payer: Self-pay | Admitting: Lab

## 2011-12-14 ENCOUNTER — Ambulatory Visit: Payer: Medicaid - Dental | Admitting: Nutrition

## 2011-12-14 DIAGNOSIS — K121 Other forms of stomatitis: Secondary | ICD-10-CM

## 2011-12-14 DIAGNOSIS — F32A Depression, unspecified: Secondary | ICD-10-CM

## 2011-12-14 DIAGNOSIS — K1230 Oral mucositis (ulcerative), unspecified: Secondary | ICD-10-CM

## 2011-12-14 DIAGNOSIS — B977 Papillomavirus as the cause of diseases classified elsewhere: Secondary | ICD-10-CM

## 2011-12-14 DIAGNOSIS — E46 Unspecified protein-calorie malnutrition: Secondary | ICD-10-CM

## 2011-12-14 DIAGNOSIS — F3289 Other specified depressive episodes: Secondary | ICD-10-CM

## 2011-12-14 DIAGNOSIS — C109 Malignant neoplasm of oropharynx, unspecified: Secondary | ICD-10-CM

## 2011-12-14 DIAGNOSIS — Z5111 Encounter for antineoplastic chemotherapy: Secondary | ICD-10-CM

## 2011-12-14 DIAGNOSIS — C01 Malignant neoplasm of base of tongue: Secondary | ICD-10-CM

## 2011-12-14 DIAGNOSIS — F329 Major depressive disorder, single episode, unspecified: Secondary | ICD-10-CM

## 2011-12-14 LAB — COMPREHENSIVE METABOLIC PANEL
Alkaline Phosphatase: 76 U/L (ref 39–117)
Creatinine, Ser: 0.79 mg/dL (ref 0.50–1.35)
Glucose, Bld: 76 mg/dL (ref 70–99)
Sodium: 143 mEq/L (ref 135–145)
Total Bilirubin: 0.3 mg/dL (ref 0.3–1.2)
Total Protein: 6.9 g/dL (ref 6.0–8.3)

## 2011-12-14 LAB — CBC WITH DIFFERENTIAL/PLATELET
BASO%: 0.4 % (ref 0.0–2.0)
EOS%: 1.5 % (ref 0.0–7.0)
MCH: 32.7 pg (ref 27.2–33.4)
MCHC: 34.6 g/dL (ref 32.0–36.0)
RDW: 12.1 % (ref 11.0–14.6)
lymph#: 0.4 10*3/uL — ABNORMAL LOW (ref 0.9–3.3)

## 2011-12-14 NOTE — Progress Notes (Signed)
[name] presents to nutrition followup.  His weight has increased to 184.8 pounds today from 180.2 pounds in October.  The patient reports that he has tolerated a variety of foods to include yogurt,  Scrambled eggs, Cream of Wheat, chicken and even some pizza.  He continues to use Vital 1.5 six cans daily via PEG.  He uses 2 cans t.i.d. to support his oral intake and he is not having any nutritional concerns at this time.  NUTRITION DIAGNOSIS:  Food and nutrition related knowledge deficit has improved.  Diagnosis of unintentional weight loss has resolved.  INTERVENTION:  I have educated [name] to begin decreasing Vital 1.5 through his tube and increasing both foods and Ensure Plus by mouth as tolerated.  He will begin with an additional 2 cans of Ensure Plus a day and eliminate 2 cans of his bolus tube feeding.  He will progress in this manner until he no longer uses his tube for nutrition support and he is either drinking 6 cans of Ensure Plus or eating equivalent amount of other foods or shakes to maintain his present weight.  I spent a fair amount of time today discussing this process and educating the patient and family on how to do this.  Patient is very confident that he will have no trouble drinking Ensure Plus or equivalent by mouth.  He is encouraged that he will begin to decrease his tube feedings as his hope is that he will be able to have his feeding tube removed.   MONITORING/EVALUATION/GOALS:  The patient has tolerated tube feedings and oral intake to promote weight gain.  He will continue to transition to an oral diet and decrease his tube feeding while maintaining his current weight.  NEXT VISIT:  There is no followup scheduled at this time.  [name] will call me if he has questions or concerns or is unable to progress to an oral diet completely.    ______________________________ Zenovia Jarred, RD, LDN Clinical Nutrition Specialist BN/MEDQ  D:  12/14/2011  T:  12/14/2011  Job:   [id number]

## 2011-12-14 NOTE — Telephone Encounter (Signed)
gve the pt his April 2013 appt calendar along with the pet scan appt with instructions.

## 2011-12-15 NOTE — Progress Notes (Signed)
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  PAST THERAPY:   concurrent chemoXRT with daily XRT and q3wk Cisplatin finished in September 2012.  CURRENT THERAPY:  watchful observation.  INTERVAL HISTORY: [name] 50 y.o. male returns to clinic today with his girlfriend and mother.  [place]  He reports some mild fatigue when he gets home.  He has occasional mouth sore depending on the kind of foods that he eats.  He has been able to eat even cracker and pizza.  However, he is still depending on osmolyte 6cans/day via PEG tube since he said that it's more convenient that way than taking orally.  He has no problem drinking fluid.  He has mild tinnitus and hearing loss at high frequency; however, he has no problem with routine conversation.  He still has xerostomia and occasional thick phlegm.  He denies intractable nausea/vomiting.  He reports that his PEG tube is dry/clean/intact without erythema/purulent discharge or pain.  He denies any palpable neck node.  His mood now is much better than when he was on therapy.   Patient denies  headache, visual changes, confusion, drenching night sweats, palpable lymph node swelling, mucositis, odynophagia, dysphagia, nausea vomiting, jaundice, chest pain, palpitation, shortness of breath, dyspnea on exertion, productive cough, gum bleeding, epistaxis, hematemesis, hemoptysis, abdominal pain, abdominal swelling, early satiety, melena, hematochezia, hematuria, skin rash, spontaneous bleeding, joint swelling, joint pain, heat or cold intolerance, bowel bladder incontinence, back pain, focal motor weakness, paresthesia.    MEDICAL HISTORY: Past Medical History  Diagnosis Date  . Depression   . Carcinoma of oropharynx   . Mucositis   . Protein calorie malnutrition     SURGICAL HISTORY: No past surgical  history on file.  MEDICATIONS: Current Outpatient Prescriptions  Medication Sig Dispense Refill  . DULoxetine (CYMBALTA) 20 MG capsule Take 20 mg by mouth daily.        . pantoprazole (PROTONIX) 40 MG tablet Take 40 mg by mouth daily.          ALLERGIES:   has no known allergies.  REVIEW OF SYSTEMS:  The rest of the 14-point review of system was negative.   Filed Vitals:   12/14/11 1109  BP: 122/74  Pulse: 69  Temp: 96.9 F (36.1 C)   Wt Readings from Last 3 Encounters:  12/14/11 184 lb 11.2 oz (83.779 kg)  10/10/11 180 lb 3.2 oz (81.738 kg)   ECOG Performance status: 0-1  PHYSICAL EXAMINATION:   General:  well-nourished in no acute distress.  Eyes:  no scleral icterus.  ENT:  There were no oropharyngeal lesions.  Neck was without thyromegaly.  Lymphatics:  Negative supraclavicular or axillary adenopathy.  I could feel shotty bilateral cervical nodes <2cm.  Respiratory: lungs were clear bilaterally without wheezing or crackles.  Cardiovascular:  Regular rate and rhythm, S1/S2, without murmur, rub or gallop.  There was no pedal edema.  GI:  abdomen was soft, flat, nontender, nondistended, without organomegaly.  PEG tube dry/clean/intact.  Muscoloskeletal:  no spinal tenderness of palpation of vertebral spine.  Skin exam was without echymosis, petichae.  Neuro exam was nonfocal.  Patient was able to get on and off exam table without assistance.  Gait was normal.  Patient was alerted and oriented.  Attention was good.   Language was appropriate.  Mood was normal without depression.  Speech was not pressured.  Thought content was not tangential.     LABORATORY/RADIOLOGY DATA:  Lab Results  Component Value Date   WBC 3.5* 12/14/2011   HGB 14.3 12/14/2011   HCT 41.4 12/14/2011   PLT 248 12/14/2011   GLUCOSE 76 12/14/2011   ALT 37 12/14/2011   AST 20 12/14/2011   NA 143 12/14/2011   K 4.0 12/14/2011   CL 104 12/14/2011   CREATININE 0.79 12/14/2011   BUN 17 12/14/2011   CO2 29  12/14/2011   TSH 2.353 12/14/2011   INR 1.04 08/16/2011    IMAGING:  I personally reviewed the following PET scan.  There was no residual uptake at the left base of the tongue and left cervical node.  There was only very faint uptake in the left level III -IV cervical node.   Nm Pet Image Restag (ps) Skull Base To Thigh  12/13/2011  *RADIOLOGY REPORT*  Clinical Data: Subsequent treatment strategy for tongue cancer. Radiation therapy last September of this year.  Chemotherapy last August of this year.  NUCLEAR MEDICINE PET CT SKULL BASE TO THIGH  Technique:  16.5 mCi F-18 FDG was injected intravenously via the right AC.  Full-ring PET imaging was performed from the skull base through the mid-thighs 65  minutes after injection.  CT data was obtained and used for attenuation correction and anatomic localization only.  (This was not acquired as a diagnostic CT examination.)  Fasting Blood Glucose:  94  Patient Weight:  175 pounds.  Comparison: 06/23/2011  Findings: PET images demonstrate interval resolution of the previously described left tongue base mass.  Mild asymmetric hypermetabolism is identified at the tongue base but favored to be physiologic.  No well-defined mass like hypermetabolism is seen. No residual CT abnormality in this area.  There is a circumferential hypermetabolism involving the supraglottic larynx with mild edema in this area on image 43 series 2.  Favored be radiation induced.  Left-sided hypermetabolic adenopathy has resolved.  Right-sided level II node measures 4 mm and a S.U.V. max of 3.0 on image 36.  This is decreased from 9 mm on the prior exam. On the prior, this measured a S.U.V. max of 7.1.  No abnormal activity within the chest, abdomen, or pelvis.  CT images performed for attenuation correction demonstrate right maxillary sinus mucous retention cyst or polyp.  No new cervical adenopathy.  Lower pole punctate right renal collecting system calculus.  Cholecystectomy clips.   Gastrostomy tube, appropriately positioned.  Colonic diverticula.  Mild prostatomegaly.  IMPRESSION:  1.  Response to therapy of left-sided tongue base primary and cervical nodal metastasis.  A small right-sided level II node demonstrates residual but decreased hypermetabolism.  Residual disease cannot be excluded. 2.  No evidence of extracervical disease.  Original Report Authenticated By: Consuello Bossier, M.D.     ASSESSMENT AND PLAN:     1. History of oropharynx squamous cell carcinoma:  I discussed with Mr. Urwin and his family members that there is no residual disease at the left base of the tongue.  The slight uptake at the right cervical neck node can be either residual disease or resolving activity.  His case was discussed at tumor board today.  Two options were raised.  The aggressive option is to proceed with left neck dissection at this time.  The conservative option is to follow up with a PET scan in about 4 months and decide at that time.  He inquired about more chemoradiation which is not  standard of care.  He will discuss this over with his family; however, he is leaning toward conservative option and repeat the PET in about 4 months.  I strongly advised him to follow up with Dr. Jenne Pane for his opinion and possibly even an in office flexible laryngoscopy to ensure resolution on his primary disease site.  I called Dr. Jenne Pane and spoke with his nurse to get an appointment for him.  2. Calorie-protein malnutrition:  This is mild. He has no severe mucositis.  I advised him to transition from PEG to more oral intake.  If over the next few months, he is able to take oral exclusively without significant weight loss, we will remove the PEG tube.  3. Depression/anxiety: stable on Cymbalta per PCP.  4. Slightly elevated ALT:  Resolved to lab today. 5. Mucositis/xerostomia secondary to chemoradiation therapy: I advised him on routine mouth rinse and biotene.  6. Follow up:   I today ordered a PET scan  to be performed in about 4 months; and I'll see him the day after.

## 2011-12-26 ENCOUNTER — Telehealth: Payer: Self-pay | Admitting: *Deleted

## 2011-12-26 ENCOUNTER — Other Ambulatory Visit: Payer: Self-pay | Admitting: Oncology

## 2011-12-26 DIAGNOSIS — C109 Malignant neoplasm of oropharynx, unspecified: Secondary | ICD-10-CM

## 2011-12-26 NOTE — Telephone Encounter (Signed)
Opened in error

## 2011-12-26 NOTE — Telephone Encounter (Signed)
Pt left VM asking when he can have his PEG tube removed?  Reports has been eating exclusively by mouth for past 2 weeks and has not lost any weight.  Note forwarded to Dr. Gaylyn Rong for instructions.

## 2011-12-28 ENCOUNTER — Encounter: Payer: Self-pay | Admitting: *Deleted

## 2011-12-28 ENCOUNTER — Ambulatory Visit: Payer: Federal, State, Local not specified - PPO | Attending: Oncology

## 2011-12-28 ENCOUNTER — Telehealth: Payer: Self-pay | Admitting: Oncology

## 2011-12-28 DIAGNOSIS — C01 Malignant neoplasm of base of tongue: Secondary | ICD-10-CM | POA: Insufficient documentation

## 2011-12-28 DIAGNOSIS — IMO0001 Reserved for inherently not codable concepts without codable children: Secondary | ICD-10-CM | POA: Insufficient documentation

## 2011-12-28 NOTE — Telephone Encounter (Signed)
S/w tina today re appt for peg tube removal within 2 wks. Per tina she has already called pt and is waiting for his return call re appt.

## 2011-12-28 NOTE — Progress Notes (Signed)
Faxed Rx from Dr. Gaylyn Rong for referral to Lymphedema clinic to Dwaine Gale at [phone].

## 2012-01-03 ENCOUNTER — Ambulatory Visit (HOSPITAL_COMMUNITY)
Admission: RE | Admit: 2012-01-03 | Discharge: 2012-01-03 | Disposition: A | Discharge status: Home / Self Care | Payer: Federal, State, Local not specified - PPO | Source: Ambulatory Visit | Attending: Oncology | Admitting: Oncology

## 2012-01-03 ENCOUNTER — Other Ambulatory Visit (HOSPITAL_COMMUNITY): Payer: Self-pay

## 2012-01-03 DIAGNOSIS — Z431 Encounter for attention to gastrostomy: Secondary | ICD-10-CM | POA: Insufficient documentation

## 2012-01-03 DIAGNOSIS — C109 Malignant neoplasm of oropharynx, unspecified: Secondary | ICD-10-CM | POA: Insufficient documentation

## 2012-01-03 MED ORDER — MEPERIDINE HCL 25 MG/ML IJ SOLN
50.0000 mg | Freq: Once | INTRAMUSCULAR | Status: AC
  Administered 2012-01-03: 50 mg via INTRAMUSCULAR

## 2012-01-03 MED ORDER — PROMETHAZINE HCL 25 MG/ML IJ SOLN
25.0000 mg | Freq: Once | INTRAMUSCULAR | Status: AC
  Administered 2012-01-03: 25 mg via INTRAMUSCULAR

## 2012-01-03 NOTE — Procedures (Signed)
Successful bed side removal of pull through G-tube.  No immediate complications.

## 2012-01-09 ENCOUNTER — Ambulatory Visit: Payer: Federal, State, Local not specified - PPO | Admitting: Physical Therapy

## 2012-01-09 ENCOUNTER — Telehealth: Payer: Self-pay | Admitting: *Deleted

## 2012-01-09 NOTE — Telephone Encounter (Signed)
Call from O.T from Lymphedema clinic asking if ok per Dr. Gaylyn Rong for pt to have Manual Lymph Drainage done which includes massaging area of edema near or at pt's neck.  Per Dr. Gaylyn Rong this is ok and safe therapy for pt.[name] Back and gave verbal order from Dr. Gaylyn Rong ok to proceed w/ this therapy.  She verbalized understanding.

## 2012-01-20 ENCOUNTER — Telehealth: Payer: Self-pay | Admitting: *Deleted

## 2012-01-20 NOTE — Telephone Encounter (Signed)
Pt called requesting a note from Dr. Gaylyn Rong faxed to his employer that pt may go back to work Full time.  Pt states he is feeling well and ready to work full time.  Faxed Rx/note from Dr. Gaylyn Rong stating "Pt may resume work full-time, 8 hrs per day" to [employer] at [phone].

## 2012-02-03 ENCOUNTER — Ambulatory Visit
Admission: RE | Admit: 2012-02-03 | Discharge: 2012-02-03 | Disposition: A | Discharge status: Home / Self Care | Payer: Federal, State, Local not specified - PPO | Source: Ambulatory Visit | Attending: Radiation Oncology | Admitting: Radiation Oncology

## 2012-02-03 ENCOUNTER — Ambulatory Visit: Payer: Federal, State, Local not specified - PPO | Attending: Oncology | Admitting: Physical Therapy

## 2012-02-03 ENCOUNTER — Encounter: Payer: Self-pay | Admitting: Radiation Oncology

## 2012-02-03 VITALS — BP 106/67 | HR 69 | Temp 98.6°F | Wt 178.9 lb

## 2012-02-03 DIAGNOSIS — C109 Malignant neoplasm of oropharynx, unspecified: Secondary | ICD-10-CM

## 2012-02-03 DIAGNOSIS — C01 Malignant neoplasm of base of tongue: Secondary | ICD-10-CM | POA: Insufficient documentation

## 2012-02-03 DIAGNOSIS — IMO0001 Reserved for inherently not codable concepts without codable children: Secondary | ICD-10-CM | POA: Insufficient documentation

## 2012-02-03 HISTORY — DX: Personal history of irradiation: Z92.3

## 2012-02-03 HISTORY — DX: Personal history of antineoplastic chemotherapy: Z92.21

## 2012-02-03 NOTE — Progress Notes (Signed)
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 He is doing well. His energy is good. He is going to the gym and working. He still has some residual ringing  in his ears. His sense of taste is coming back. 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 He is able to chew fairly well and he has no major dysphagia. His does has some difficulty with bread. The sores in his mouth has resolved. He does have some salivary function but his mouth is dryer than it had been prior to radiotherapy. He is seen a dentist in the community and using his fluoride trays. He denies  significant weight loss. He had his PEG tube removed recently.  A PET scan on 12/13/2011 demonstrated a right-sided level II node measures 4 mm and a S.U.V. max of 3.0  - this is decreased from 9 mm on the prior exam. On the prior, this measured a S.U.V. max of 7.1. He is going to have a repeat PET scan in April for this. He continues to be followed by medical oncology and otolaryngology.     ALLERGIES: Shellfish allergy   MEDICATIONS:  Current Outpatient Prescriptions  Medication Sig Dispense Refill  . DULoxetine (CYMBALTA) 20 MG capsule Take 20 mg by mouth daily.        . pantoprazole (PROTONIX) 40 MG tablet Take 40 mg by mouth daily.             PHYSICAL EXAM:   weight is 178 lb 14.4 oz (81.149 kg). His temperature is 98.6 F (37 C). His blood pressure is 106/67 and his pulse is 69. His oxygen saturation is 98%.  General: Alert and oriented, in no acute distress HEENT: Head is normocephalic. The hair has grown back nicely at the back of his neck. Oropharynx reveals some salivary function. He has no thrush or palpable lesions in the  mouth or base of tongue. His teeth are in good repair. Neck: Neck is notable for some subcutaneous edema ; no palpable cervical or supraclavicular lymphadenopathy. Heart: Regular in rate and rhythm with no murmurs, rubs, or gallops. Chest: Clear to auscultation bilaterally, with no rhonchi, wheezes, or rales. Extremities: No cyanosis or edema. Lymphatics: No concerning lymphadenopathy. Skin: No concerning lesions. Neurologic: Cranial nerves II through XII are grossly intact. No obvious focalities. Speech is fluent. Coordination is intact. Psychiatric: Judgment and insight are intact. Affect is appropriate.     LABORATORY DATA:  Lab Results  Component Value Date   WBC 3.5* 12/14/2011   HGB 14.3 12/14/2011   HCT 41.4 12/14/2011   MCV 94.4 12/14/2011   PLT 248 12/14/2011   CMP     Component Value Date/Time   NA 143 12/14/2011 1025   K 4.0 12/14/2011 1025   CL 104 12/14/2011 1025   CO2 29 12/14/2011 1025   GLUCOSE 76 12/14/2011 1025   BUN 17 12/14/2011 1025   CREATININE 0.79 12/14/2011 1025   CALCIUM 9.6 12/14/2011 1025   PROT 6.9 12/14/2011 1025   ALBUMIN 4.4 12/14/2011 1025   AST 20 12/14/2011 1025   ALT 37 12/14/2011 1025   ALKPHOS 76 12/14/2011 1025   BILITOT 0.3 12/14/2011 1025   GFRNONAA >60  08/20/2011 0456   GFRAA >60 08/20/2011 0456         RADIOGRAPHIC STUDIES:  As above    IMPRESSION/PLAN: He is doing well without obvious evidence of disease on physical exam, but we will be following him closely with another PET scan in April. I will see him back in approximately 4 months so that I can stagger my followup with the other specialists. Encouraged patient to continue his dental hygiene and physical therapy appointments. He knows to call me if he has any issues before next followup.

## 2012-02-03 NOTE — Progress Notes (Signed)
Peg Tube removed.  Eating soft foods and meat cut in to small amounts.  Some difficulty swallowing thick breads.  Denies any odonyphagia.  Oral mucosa moist and without any redness nor ulcerations.   Working fulltime now.   Going to the gym now.    Skin on neck soft intact.

## 2012-02-06 ENCOUNTER — Ambulatory Visit: Payer: Federal, State, Local not specified - PPO | Admitting: Physical Therapy

## 2012-02-07 NOTE — Progress Notes (Signed)
Received discharge summary from [facility] outpatient Re[facility]; forwarded to Dr. Gaylyn Rong.

## 2012-02-10 ENCOUNTER — Ambulatory Visit: Payer: Federal, State, Local not specified - PPO | Admitting: Radiation Oncology

## 2012-02-20 ENCOUNTER — Encounter: Payer: Self-pay | Admitting: Physical Therapy

## 2012-04-12 ENCOUNTER — Other Ambulatory Visit (HOSPITAL_BASED_OUTPATIENT_CLINIC_OR_DEPARTMENT_OTHER): Payer: Federal, State, Local not specified - PPO | Admitting: Lab

## 2012-04-12 ENCOUNTER — Other Ambulatory Visit: Payer: Self-pay | Admitting: Oncology

## 2012-04-12 ENCOUNTER — Encounter (HOSPITAL_COMMUNITY)
Admission: RE | Admit: 2012-04-12 | Discharge: 2012-04-12 | Disposition: A | Discharge status: Home / Self Care | Payer: Federal, State, Local not specified - PPO | Source: Ambulatory Visit | Attending: Oncology | Admitting: Oncology

## 2012-04-12 ENCOUNTER — Encounter (HOSPITAL_COMMUNITY): Payer: Self-pay

## 2012-04-12 DIAGNOSIS — F32A Depression, unspecified: Secondary | ICD-10-CM

## 2012-04-12 DIAGNOSIS — E46 Unspecified protein-calorie malnutrition: Secondary | ICD-10-CM

## 2012-04-12 DIAGNOSIS — F329 Major depressive disorder, single episode, unspecified: Secondary | ICD-10-CM

## 2012-04-12 DIAGNOSIS — K1230 Oral mucositis (ulcerative), unspecified: Secondary | ICD-10-CM

## 2012-04-12 DIAGNOSIS — C109 Malignant neoplasm of oropharynx, unspecified: Secondary | ICD-10-CM

## 2012-04-12 DIAGNOSIS — F3289 Other specified depressive episodes: Secondary | ICD-10-CM

## 2012-04-12 DIAGNOSIS — C01 Malignant neoplasm of base of tongue: Secondary | ICD-10-CM

## 2012-04-12 DIAGNOSIS — Z923 Personal history of irradiation: Secondary | ICD-10-CM | POA: Insufficient documentation

## 2012-04-12 DIAGNOSIS — Z9221 Personal history of antineoplastic chemotherapy: Secondary | ICD-10-CM | POA: Insufficient documentation

## 2012-04-12 LAB — CBC WITH DIFFERENTIAL/PLATELET
BASO%: 0.6 % (ref 0.0–2.0)
Basophils Absolute: 0 10*3/uL (ref 0.0–0.1)
Eosinophils Absolute: 0.1 10*3/uL (ref 0.0–0.5)
HCT: 42.1 % (ref 38.4–49.9)
HGB: 14.1 g/dL (ref 13.0–17.1)
LYMPH%: 18.9 % (ref 14.0–49.0)
MCHC: 33.6 g/dL (ref 32.0–36.0)
MONO#: 0.3 10*3/uL (ref 0.1–0.9)
NEUT%: 68 % (ref 39.0–75.0)
Platelets: 206 10*3/uL (ref 140–400)
WBC: 2.8 10*3/uL — ABNORMAL LOW (ref 4.0–10.3)

## 2012-04-12 LAB — COMPREHENSIVE METABOLIC PANEL
Albumin: 4.3 g/dL (ref 3.5–5.2)
BUN: 9 mg/dL (ref 6–23)
CO2: 29 mEq/L (ref 19–32)
Calcium: 8.9 mg/dL (ref 8.4–10.5)
Chloride: 109 mEq/L (ref 96–112)
Creatinine, Ser: 0.92 mg/dL (ref 0.50–1.35)
Glucose, Bld: 83 mg/dL (ref 70–99)
Potassium: 4.2 mEq/L (ref 3.5–5.3)

## 2012-04-12 MED ORDER — FLUDEOXYGLUCOSE F - 18 (FDG) INJECTION
15.5000 | Freq: Once | INTRAVENOUS | Status: AC | PRN
  Administered 2012-04-12: 15.5 via INTRAVENOUS

## 2012-04-12 NOTE — Patient Instructions (Signed)
A.  PET scan 04/12/2012 Result:  Comparison: 12/13/2011  Findings:  Neck: No hypermetabolic lymph nodes in the neck. Previously noted  shotty right level II lymph nodes no longer show any hypermetabolic  activity on today's exam.  Chest: No hypermetabolic mediastinal or hilar nodes. No  suspicious pulmonary nodules on the CT scan.  Abdomen/Pelvis: No abnormal hypermetabolic activity within the  liver, pancreas, adrenal glands, or spleen. No hypermetabolic  lymph nodes in the abdomen or pelvis.  [place]: No focal hypermetabolic activity to suggest skeletal  metastasis.   IMPRESSION:  Complete metabolic response to therapy. No residual metabolically  active disease identified.  B.  Follow up: - CT neck in about 6 months with follow up with Dr. Lodema Pilot nurse practitioner the day after.

## 2012-04-13 ENCOUNTER — Telehealth: Payer: Self-pay | Admitting: Oncology

## 2012-04-13 ENCOUNTER — Ambulatory Visit (HOSPITAL_BASED_OUTPATIENT_CLINIC_OR_DEPARTMENT_OTHER): Payer: Federal, State, Local not specified - PPO | Admitting: Oncology

## 2012-04-13 VITALS — BP 114/81 | HR 52 | Temp 96.9°F | Ht 73.0 in | Wt 181.0 lb

## 2012-04-13 DIAGNOSIS — C109 Malignant neoplasm of oropharynx, unspecified: Secondary | ICD-10-CM

## 2012-04-13 DIAGNOSIS — C01 Malignant neoplasm of base of tongue: Secondary | ICD-10-CM

## 2012-04-13 DIAGNOSIS — B977 Papillomavirus as the cause of diseases classified elsewhere: Secondary | ICD-10-CM

## 2012-04-13 NOTE — Progress Notes (Signed)
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   PAST THERAPY: concurrent chemoXRT with daily XRT and q3wk Cisplatin finished in September 2012.   CURRENT THERAPY: watchful observation.  INTERVAL HISTORY: [name] 51 y.o. male returns for regular follow up with his fiance.  He reports feeling well.  He has been able to eat orally 100% without dysphagia, odynophagia.  He does have dry mouth but without mucositis.  He denies palpable nodes. He is working full time without fatigue.     Patient denies fatigue, headache, visual changes, confusion, drenching night sweats, palpable lymph node swelling, mucositis, odynophagia, dysphagia, nausea vomiting, jaundice, chest pain, palpitation, shortness of breath, dyspnea on exertion, productive cough, gum bleeding, epistaxis, hematemesis, hemoptysis, abdominal pain, abdominal swelling, early satiety, melena, hematochezia, hematuria, skin rash, spontaneous bleeding, joint swelling, joint pain, heat or cold intolerance, bowel bladder incontinence, back pain, focal motor weakness, paresthesia, depression, suicidal or homocidal ideation, feeling hopelessness.   Past Medical History  Diagnosis Date  . Depression   . Carcinoma of oropharynx   . Mucositis   . Protein calorie malnutrition   . Status post radiation therapy 07/26/11 - 09/15/11    70 Gy in 35 fractions Base of Tongue and Bilateral neck  . Status post chemotherapy 08/2011    Cisplatin Q 3 weeks concurrent wih Radiation Therapy    No past surgical history on file.  Current Outpatient Prescriptions  Medication Sig Dispense Refill  . DULoxetine (CYMBALTA) 20 MG capsule Take 20 mg by mouth daily.        . Multiple Vitamins-Minerals (MULTIVITAMIN WITH MINERALS) tablet Take 1 tablet by mouth daily.      . pantoprazole  (PROTONIX) 40 MG tablet Take 40 mg by mouth daily.          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  REVIEW OF SYSTEMS:  The rest of the 14-point review of system was negative.   Filed Vitals:   04/13/12 1202  BP: 114/81  Pulse: 52  Temp: 96.9 F (36.1 C)   Wt Readings from Last 3 Encounters:  04/13/12 181 lb (82.101 kg)  02/03/12 178 lb 14.4 oz (81.149 kg)  12/14/11 184 lb 11.2 oz (83.779 kg)   ECOG Performance status: 0  PHYSICAL EXAMINATION:  General:  well-nourished in no acute distress.  Eyes:  no scleral icterus.  ENT:  There were no oropharyngeal lesions.  Neck was without thyromegaly.  Lymphatics:  Negative cervical, supraclavicular or axillary adenopathy.  Respiratory: lungs were clear bilaterally without wheezing or crackles.  Cardiovascular:  Regular rate and rhythm, S1/S2, without murmur, rub or gallop.  There was no pedal edema.  GI:  abdomen was soft, flat, nontender, nondistended, without organomegaly.  Muscoloskeletal:  no spinal tenderness of palpation of vertebral spine.  Skin exam was without echymosis, petichae.  Neuro exam was nonfocal.  Patient was able to get on and off exam table without assistance.  Gait was normal.  Patient was alerted and oriented.  Attention was good.   Language was appropriate.  Mood was normal without depression.  Speech was not pressured.  Thought content was not tangential.     LABORATORY/RADIOLOGY DATA:  Lab Results  Component Value Date   WBC 2.8* 04/12/2012   HGB 14.1 04/12/2012   HCT 42.1 04/12/2012   PLT 206 04/12/2012  GLUCOSE 83 04/12/2012   ALKPHOS 61 04/12/2012   ALT 22 04/12/2012   AST 18 04/12/2012   NA 144 04/12/2012   K 4.2 04/12/2012   CL 109 04/12/2012   CREATININE 0.92 04/12/2012   BUN 9 04/12/2012   CO2 29 04/12/2012   INR 1.04 08/16/2011   IMAGING:  I personally reviewed the following PET scan and showed the pictures to the patient and his fiance.    Nm Pet Image Restag (ps) Skull Base To Thigh  04/12/2012   *RADIOLOGY REPORT*  Clinical Data: Subsequent treatment strategy for oropharyngeal carcinoma.  NUCLEAR MEDICINE PET SKULL BASE TO THIGH  Fasting Blood Glucose:  84  Technique:  15.5 mCi F-18 FDG was injected intravenously. CT data was obtained and used for attenuation correction and anatomic localization only.  (This was not acquired as a diagnostic CT examination.) Additional exam technical data entered on technologist worksheet.  Comparison:  12/13/2011  Findings:  Neck: No hypermetabolic lymph nodes in the neck.  Previously noted shotty right level II lymph nodes no longer show any hypermetabolic activity on today's exam.  Chest:  No hypermetabolic mediastinal or hilar nodes.  No suspicious pulmonary nodules on the CT scan.  Abdomen/Pelvis:  No abnormal hypermetabolic activity within the liver, pancreas, adrenal glands, or spleen.  No hypermetabolic lymph nodes in the abdomen or pelvis.  Skel[place]:  No focal hypermetabolic activity to suggest skeletal metastasis.  IMPRESSION: Complete metabolic response to therapy.  No residual metabolically active disease identified.  Original Report Authenticated By: Danae Orleans, M.D.     ASSESSMENT AND PLAN:   1. History of oropharynx squamous cell carcinoma: I discussed with Mr. Minella and his fiance that he has no evidence of recurrent or metastatic disease on clinical history, physical exam, lab, and PET scan.  I recommended again watchful observation.  2. Calorie-protein malnutrition: resolved.  He is now taking his foods orally and has maintained his weight.   3. Depression/anxiety: stable on Cymbalta per PCP.  4. Slightly elevated ALT: Resolved to lab today. 5. Slight uptake on the right neck node on post treatment PET scan 4 months ago:  Resolved on PET today.  6. Follow up:  CT neck and RV with [place] in about 6 months.  I advised him to also follow up with ENT and Rad Onc between visits with [place].    The length of time of the face-to-face encounter was 15  minutes. More than 50% of time was spent counseling and coordination of care.

## 2012-04-13 NOTE — Telephone Encounter (Signed)
appts made and printed for pt aom °

## 2012-06-01 ENCOUNTER — Ambulatory Visit
Admission: RE | Admit: 2012-06-01 | Discharge: 2012-06-01 | Disposition: A | Discharge status: Home / Self Care | Payer: Federal, State, Local not specified - PPO | Source: Ambulatory Visit | Attending: Radiation Oncology | Admitting: Radiation Oncology

## 2012-06-01 ENCOUNTER — Encounter: Payer: Self-pay | Admitting: Radiation Oncology

## 2012-06-01 VITALS — BP 114/77 | HR 68 | Temp 97.1°F | Wt 183.1 lb

## 2012-06-01 DIAGNOSIS — C109 Malignant neoplasm of oropharynx, unspecified: Secondary | ICD-10-CM

## 2012-06-01 NOTE — Progress Notes (Signed)
  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 He is doing well. He is following up with his dentist regularly. He is now working again and his energy is fairly good. He has some xerostomia but it is mainly noticeable at night and not so much during the day. He is a little bit of subcutaneous edema in his neck but it has mostly resolved. He denies any trismus. He has not appreciated any sore throat, dysphagia or Lumps in his neck.      PET scan on April 18 showed no residual disease.                    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  Meds: Current Outpatient Prescriptions  Medication Sig Dispense Refill  . Multiple Vitamins-Minerals (MULTIVITAMIN WITH MINERALS) tablet Take 1 tablet by mouth daily.        Physical Findings: The patient is in no acute distress. Patient is alert and oriented.  weight is 183 lb 1.6 oz (83.054 kg). His temperature is 97.1 F (36.2 C). His blood pressure is 114/77 and his pulse is 68. Marland Kitchen  Oropharynx demonstrates fair dentition. No palpable or visual tumors in his oropharynx. No trismus.  He has some salivary function with moist mucous membranes. Neck is smooth with minimal subcutaneous edema. No palpable adenopathy in his neck  Lab Findings: Lab Results  Component Value Date   WBC 2.8* 04/12/2012   HGB 14.1 04/12/2012   HCT 42.1 04/12/2012   MCV 92.4 04/12/2012   PLT 206 04/12/2012    Radiographic Findings:  Nm Pet Image Restag (ps) Skull Base To Thigh  04/12/2012 *RADIOLOGY REPORT* Clinical Data: Subsequent treatment strategy for oropharyngeal carcinoma. NUCLEAR MEDICINE PET SKULL BASE TO THIGH Fasting Blood Glucose: 84 Technique: 15.5 mCi F-18 FDG  was injected intravenously. CT data was obtained and used for attenuation correction and anatomic localization only. (This was not acquired as a diagnostic CT examination.) Additional exam technical data entered on technologist worksheet. Comparison: 12/13/2011 Findings: Neck: No hypermetabolic lymph nodes in the neck. Previously noted shotty right level II lymph nodes no longer show any hypermetabolic activity on today's exam. Chest: No hypermetabolic mediastinal or hilar nodes. No suspicious pulmonary nodules on the CT scan. Abdomen/Pelvis: No abnormal hypermetabolic activity within the liver, pancreas, adrenal glands, or spleen. No hypermetabolic lymph nodes in the abdomen or pelvis. Skel[place]: No focal hypermetabolic activity to suggest skeletal metastasis. IMPRESSION: Complete metabolic response to therapy. No residual metabolically active disease identified. Original Report Authenticated By: Danae Orleans, M.D.   Impression:  The patient is recovering from the effects of radiation.  No evidence of disease  Plan:  Followup in 6 months. The patient been encouraged to call me if he has issues before then. He reports that he is also following up with his surgeon and medical oncologist regularly.  -----------------------------------  Lonie Peak, MD

## 2012-06-01 NOTE — Progress Notes (Signed)
Here for routine follow up post radiation of tongue ca, treatment completed 08/2011.Doing well.Continued dry mouth.Difficulty chewing food but weight has been maintained. Pet scan 04/12/12 reveals good response to treatment.

## 2012-06-08 ENCOUNTER — Ambulatory Visit: Payer: Federal, State, Local not specified - PPO | Admitting: Radiation Oncology

## 2012-08-13 ENCOUNTER — Telehealth: Payer: Self-pay | Admitting: *Deleted

## 2012-08-13 NOTE — Telephone Encounter (Signed)
Pt called left another VM he was returning call.  I called pt back and also left another VM asking him to try to call back again.

## 2012-08-13 NOTE — Telephone Encounter (Signed)
Pt left VM reporting some new symptoms which include mouth tenderness and shoulder pain,  Requests visit w/ Dr. Gaylyn Rong..   Called pt back and left him a VM to return call to nurse again to discuss symptoms.Marland Kitchen

## 2012-08-15 ENCOUNTER — Telehealth: Payer: Self-pay | Admitting: Oncology

## 2012-08-15 ENCOUNTER — Other Ambulatory Visit: Payer: Self-pay | Admitting: Oncology

## 2012-08-15 ENCOUNTER — Telehealth: Payer: Self-pay | Admitting: *Deleted

## 2012-08-15 DIAGNOSIS — C109 Malignant neoplasm of oropharynx, unspecified: Secondary | ICD-10-CM

## 2012-08-15 NOTE — Telephone Encounter (Signed)
Please tell him to take Tylenol prn pain.  I will turn in a POF for him to see Belenda Cruise within one week.

## 2012-08-15 NOTE — Telephone Encounter (Signed)
Pt c/o new Left shoulder and neck aching, denies swelling,  X 4 days.  Pressure/ fullness in left ear x about 3 days. Soreness on left tongue base x about 4 days.  Saw Dr. Jenne Pane about one month ago, but did not have these symptoms at that time.

## 2012-08-15 NOTE — Telephone Encounter (Signed)
lmonvm advising the pt of his r/s oct appts to next week per md orders

## 2012-08-15 NOTE — Telephone Encounter (Signed)
Instructed pt on Dr. Lodema Pilot reply below to take tylenol prn pain and to expect call from scheduler for appt here w/i one week.  He verbalized understanding.

## 2012-08-22 ENCOUNTER — Other Ambulatory Visit: Payer: Self-pay | Admitting: *Deleted

## 2012-08-22 ENCOUNTER — Other Ambulatory Visit: Payer: Self-pay

## 2012-08-22 ENCOUNTER — Telehealth: Payer: Self-pay | Admitting: Oncology

## 2012-08-22 ENCOUNTER — Ambulatory Visit: Payer: Self-pay | Admitting: Oncology

## 2012-08-22 ENCOUNTER — Telehealth: Payer: Self-pay | Admitting: *Deleted

## 2012-08-22 NOTE — Telephone Encounter (Signed)
Pt left VM states needs to cancel appt w/ Belenda Cruise today.  He was able to see Dr. Jenne Pane this week and Dr. Jenne Pane told him everything is fine. (pt called last week about left neck pain and left tongue sore).  Pt will keep app for CT in October as scheduled.  I sent new POF to scheduling to r/s the appt w/ Belenda Cruise for w/i 1 to 2 days CT scan is done.

## 2012-08-22 NOTE — Telephone Encounter (Signed)
S/w pt re appts for 10/18 ct and 10/23 lb/fu. appts per 8/28 pof and ct was already scheduled.

## 2012-08-26 IMAGING — PT NM PET TUM IMG INITIAL (PI) SKULL BASE T - THIGH
6 series · 25 of 25 positions shown · non-contrast
Comparison: Neck CT 06/15/2011

CLINICAL DATA: Initial treatment strategy for tongue cancer.

NUCLEAR MEDICINE PET SKULL BASE TO THIGH
Fasting Blood Glucose:  103
TECHNIQUE: 17.4 mCi F-18 FDG was injected intravenously via the
right side.  Full-ring PET imaging was performed from the skull
base through the mid-thighs 66  minutes after injection.  CT data
was obtained and used for attenuation correction and anatomic
localization only.  (This was not acquired as a diagnostic CT
examination.)

[Series 1: pet ac · axial · 3.3mm · 4.69mm/px · z∈[-304,-10]mm · 4 of 91 slices shown]
[im 1/91]
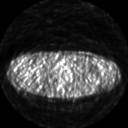
[im 31/91]
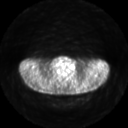
[im 61/91]
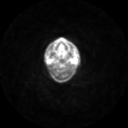
[im 91/91]
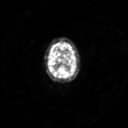

[Series 2: pet nac · axial · 3.3mm · 4.69mm/px · z∈[-304,-10]mm · 5 of 91 slices shown]
[im 1/91]
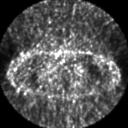
[im 23/91]
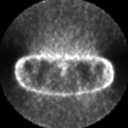
[im 46/91]
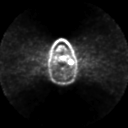
[im 68/91]
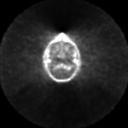
[im 91/91]
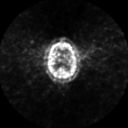

[Series 2: ct neck · axial · 3.8mm · 0.98mm/px · z∈[-304,-10]mm · 5 of 91 slices shown]
[im 1/91]
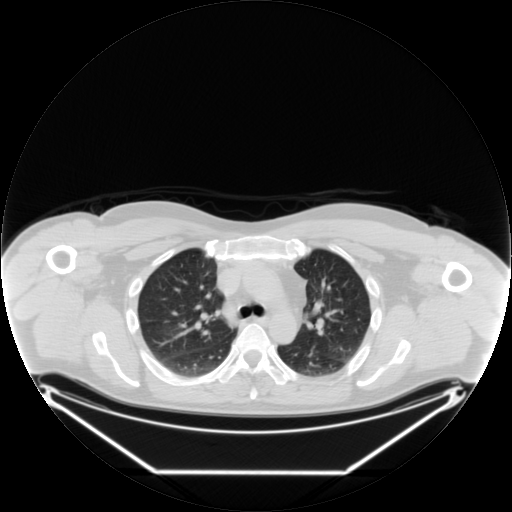
[im 23/91]
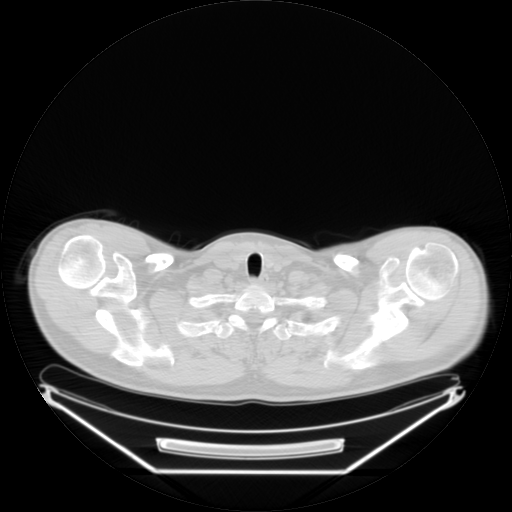
[im 46/91]
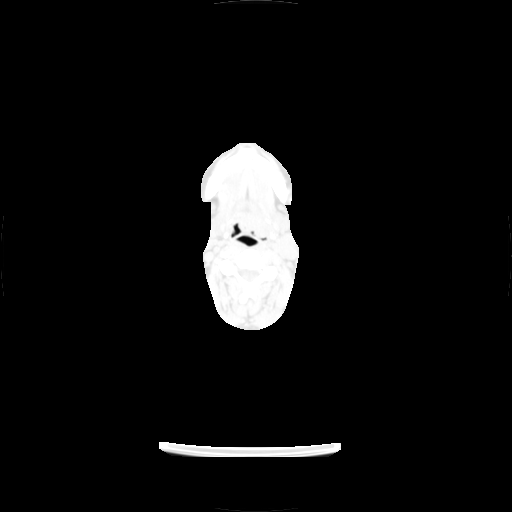
[im 68/91]
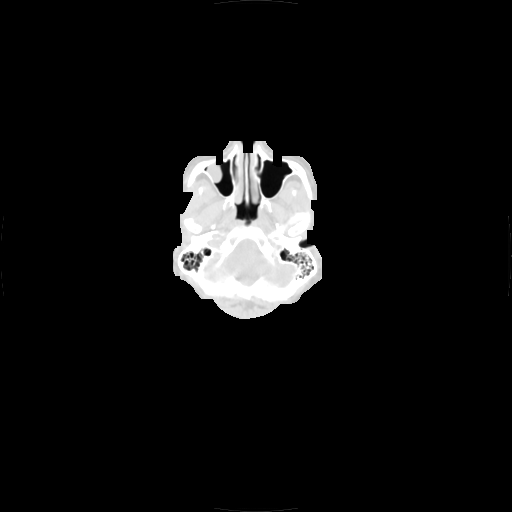
[im 91/91  brain]
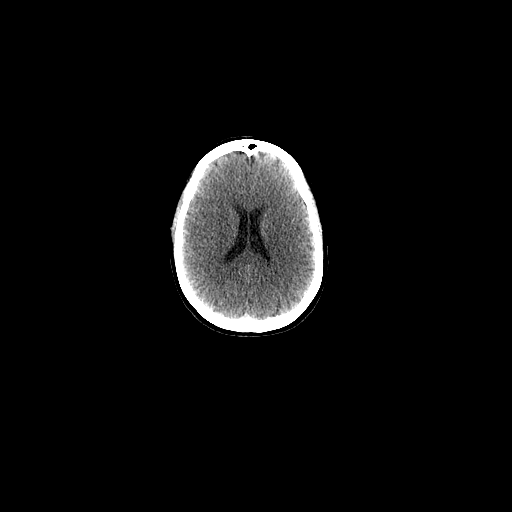

[Series 123: mip · coronal · 3.3mm · 4.69mm/px · 2 of 30 slices shown]
[im 1/30]
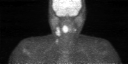
[im 30/30]
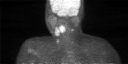

[Series 151: reformatted · axial · 3.3mm · 3.91mm/px · z∈[-304,-10]mm · 5 of 91 slices shown (1 of 2)]
[im 1/91]
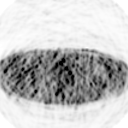
[im 23/91]
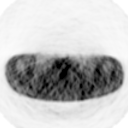
[im 46/91]
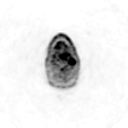
[im 68/91]
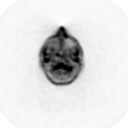
[im 91/91]
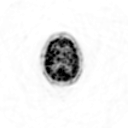

[Series 153: reformatted · coronal · 4.7mm · 4.69mm/px · 4 of 76 slices shown (2 of 2)]
[im 1/76]
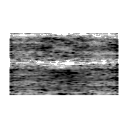
[im 26/76]
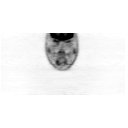
[im 51/76]
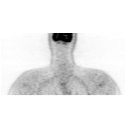
[im 76/76]
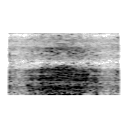

[25 of 25 positions shown; findings below may reference images not displayed]

FINDINGS: Neck:There is an intensely hypermetabolic to 2.9 x 1.7  centimeter
mass at the left base of tongue with SUV max = 18.5 (image 42,
dedicated neck series).  No additional hypermetabolic activity
within the oropharynx or hypopharynx.  Glottis appears normal.

There is a cluster of hypermetabolic left level II lymph nodes
which measure approximately 14 mm short axis (image 46) with SUV
max = 14.6.  At the same level on the right, there is a smaller
hypermetabolic right level II lymph node measuring 9 mm short axis
(image 44) with SUV max = 7.1.  Within the more inferior left neck,
there is hypermetabolic level IV lymph node measuring 8 mm (image
60) with SUV max = 6.1.

Chest:No hypermetabolic mediastinal or hilar nodes.  No suspicious
pulmonary nodules.

Abdomen / Pelvis:No abnormal hypermetabolic activity within the
solid organs.  No evidence of abdominal or pelvic hypermetabolic
nodes.

Skeleton:No focal hypermetabolic activity to suggest skeletal
metastasis.
IMPRESSION: 1. Hypermetabolic right base of tongue mass consistent with primary
carcinoma.
2.  Bulky nodal metastasis to the left level II nodal station.
3.  Smaller nodal metastasis within a contralateral right level II
lymph node and ipsilateral  left level IV lymph node.
4.  No evidence of distant metastasis.

## 2012-09-19 ENCOUNTER — Telehealth: Payer: Self-pay | Admitting: *Deleted

## 2012-09-19 NOTE — Telephone Encounter (Signed)
Pt called to ask if he needs labs prior to CT on 10/18?  He also reports increased fatigue and wondering if his thyroid level is ok?  Discussed w/ Clenton Pare and she instructed to move lab appt from 10/23 to 10/18 before CT scan.  TSH is ordered w/ other labs.  Keep all other appts same.  POF sent.   Called pt back and instructed on above. Informed him thyroid level has been wnl but can change and will be re checked on next lab appt.. He says increase in fatigue over past month.  He asks if going to gym to exercise might help?  Denies any sob or chest pains.  Encouraged pt to exercise, but to not over do it, build up slowly.  He verbalized understanding.

## 2012-09-20 ENCOUNTER — Telehealth: Payer: Self-pay | Admitting: Oncology

## 2012-09-20 NOTE — Telephone Encounter (Signed)
S/w the pt and he is aware of his add on lab appt prior to the ct scan on 10/12/2012

## 2012-10-12 ENCOUNTER — Other Ambulatory Visit: Payer: Self-pay | Admitting: Lab

## 2012-10-12 ENCOUNTER — Ambulatory Visit (HOSPITAL_COMMUNITY)
Admission: RE | Admit: 2012-10-12 | Discharge: 2012-10-12 | Disposition: A | Discharge status: Home / Self Care | Payer: Federal, State, Local not specified - PPO | Source: Ambulatory Visit | Attending: Oncology | Admitting: Oncology

## 2012-10-12 ENCOUNTER — Other Ambulatory Visit (HOSPITAL_BASED_OUTPATIENT_CLINIC_OR_DEPARTMENT_OTHER): Payer: Federal, State, Local not specified - PPO

## 2012-10-12 DIAGNOSIS — C109 Malignant neoplasm of oropharynx, unspecified: Secondary | ICD-10-CM

## 2012-10-12 DIAGNOSIS — M479 Spondylosis, unspecified: Secondary | ICD-10-CM | POA: Insufficient documentation

## 2012-10-12 DIAGNOSIS — C01 Malignant neoplasm of base of tongue: Secondary | ICD-10-CM

## 2012-10-12 DIAGNOSIS — Z9221 Personal history of antineoplastic chemotherapy: Secondary | ICD-10-CM | POA: Insufficient documentation

## 2012-10-12 DIAGNOSIS — Z923 Personal history of irradiation: Secondary | ICD-10-CM | POA: Insufficient documentation

## 2012-10-12 LAB — COMPREHENSIVE METABOLIC PANEL (CC13)
BUN: 12 mg/dL (ref 7.0–26.0)
CO2: 23 mEq/L (ref 22–29)
Calcium: 9.4 mg/dL (ref 8.4–10.4)
Creatinine: 0.9 mg/dL (ref 0.7–1.3)
Glucose: 86 mg/dl (ref 70–99)
Total Bilirubin: 0.8 mg/dL (ref 0.20–1.20)

## 2012-10-12 LAB — CBC WITH DIFFERENTIAL/PLATELET
Basophils Absolute: 0 10*3/uL (ref 0.0–0.1)
Eosinophils Absolute: 0 10*3/uL (ref 0.0–0.5)
HGB: 15 g/dL (ref 13.0–17.1)
LYMPH%: 20.4 % (ref 14.0–49.0)
MCH: 30.7 pg (ref 27.2–33.4)
MCV: 89.9 fL (ref 79.3–98.0)
MONO%: 10.1 % (ref 0.0–14.0)
NEUT#: 2.2 10*3/uL (ref 1.5–6.5)
NEUT%: 67.7 % (ref 39.0–75.0)
Platelets: 210 10*3/uL (ref 140–400)

## 2012-10-12 MED ORDER — IOHEXOL 300 MG/ML  SOLN
100.0000 mL | Freq: Once | INTRAMUSCULAR | Status: AC | PRN
  Administered 2012-10-12: 100 mL via INTRAVENOUS

## 2012-10-15 ENCOUNTER — Ambulatory Visit: Payer: Self-pay | Admitting: Oncology

## 2012-10-17 ENCOUNTER — Other Ambulatory Visit: Payer: Self-pay | Admitting: Lab

## 2012-10-17 ENCOUNTER — Telehealth: Payer: Self-pay | Admitting: Oncology

## 2012-10-17 ENCOUNTER — Ambulatory Visit (HOSPITAL_BASED_OUTPATIENT_CLINIC_OR_DEPARTMENT_OTHER): Payer: Federal, State, Local not specified - PPO | Admitting: Oncology

## 2012-10-17 VITALS — BP 124/74 | HR 81 | Temp 98.7°F | Resp 20 | Ht 73.0 in | Wt 185.7 lb

## 2012-10-17 DIAGNOSIS — C01 Malignant neoplasm of base of tongue: Secondary | ICD-10-CM

## 2012-10-17 DIAGNOSIS — F411 Generalized anxiety disorder: Secondary | ICD-10-CM

## 2012-10-17 DIAGNOSIS — C109 Malignant neoplasm of oropharynx, unspecified: Secondary | ICD-10-CM

## 2012-10-17 DIAGNOSIS — B977 Papillomavirus as the cause of diseases classified elsewhere: Secondary | ICD-10-CM

## 2012-10-17 DIAGNOSIS — Z23 Encounter for immunization: Secondary | ICD-10-CM

## 2012-10-17 MED ORDER — INFLUENZA VIRUS VACC SPLIT PF IM SUSP
0.5000 mL | Freq: Once | INTRAMUSCULAR | Status: AC
  Administered 2012-10-17: 0.5 mL via INTRAMUSCULAR
  Filled 2012-10-17: qty 0.5

## 2012-10-17 NOTE — Telephone Encounter (Signed)
gv and printed pt appt schedule for April 2014 ° °

## 2012-10-18 ENCOUNTER — Encounter: Payer: Self-pay | Admitting: Oncology

## 2012-10-18 NOTE — Progress Notes (Signed)
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   PAST THERAPY: concurrent chemoXRT with daily XRT and q3wk Cisplatin finished in September 2012.   CURRENT THERAPY: watchful observation.  INTERVAL HISTORY: [name] 51 y.o. male returns for regular follow up with his fiance.  He reports feeling well.  He has been able to eat orally 100% without dysphagia, odynophagia.  He does have dry mouth but without mucositis.  He denies palpable nodes. He is working full time without fatigue.  Admits to not sleeping well. He stays awake worried at night.   Patient denies fatigue, headache, visual changes, confusion, drenching night sweats, palpable lymph node swelling, mucositis, odynophagia, dysphagia, nausea vomiting, jaundice, chest pain, palpitation, shortness of breath, dyspnea on exertion, productive cough, gum bleeding, epistaxis, hematemesis, hemoptysis, abdominal pain, abdominal swelling, early satiety, melena, hematochezia, hematuria, skin rash, spontaneous bleeding, joint swelling, joint pain, heat or cold intolerance, bowel bladder incontinence, back pain, focal motor weakness, paresthesia, depression, suicidal or homocidal ideation, feeling hopelessness.   Past Medical History  Diagnosis Date  . Depression   . Carcinoma of oropharynx   . Mucositis   . Protein calorie malnutrition   . Status post radiation therapy 07/26/11 - 09/15/11    70 Gy in 35 fractions Base of Tongue and Bilateral neck  . Status post chemotherapy 08/2011    Cisplatin Q 3 weeks concurrent wih Radiation Therapy    History reviewed. No pertinent past surgical history.  Current Outpatient Prescriptions  Medication Sig Dispense Refill  . Multiple Vitamins-Minerals (MULTIVITAMIN WITH MINERALS) tablet Take 1 tablet by mouth daily.        ALLERGIES:  is  allergic to shellfish allergy.  REVIEW OF SYSTEMS:  The rest of the 14-point review of system was negative.   Filed Vitals:   10/17/12 1313  BP: 124/74  Pulse: 81  Temp: 98.7 F (37.1 C)  Resp: 20   Wt Readings from Last 3 Encounters:  10/17/12 185 lb 11.2 oz (84.233 kg)  06/01/12 183 lb 1.6 oz (83.054 kg)  04/13/12 181 lb (82.101 kg)   ECOG Performance status: 0  PHYSICAL EXAMINATION:  General:  well-nourished in no acute distress.  Eyes:  no scleral icterus.  ENT:  There were no oropharyngeal lesions.  Neck was without thyromegaly.  Lymphatics:  Negative cervical, supraclavicular or axillary adenopathy.  Respiratory: lungs were clear bilaterally without wheezing or crackles.  Cardiovascular:  Regular rate and rhythm, S1/S2, without murmur, rub or gallop.  There was no pedal edema.  GI:  abdomen was soft, flat, nontender, nondistended, without organomegaly.  Muscoloskeletal:  no spinal tenderness of palpation of vertebral spine.  Skin exam was without echymosis, petichae.  Neuro exam was nonfocal.  Patient was able to get on and off exam table without assistance.  Gait was normal.  Patient was alerted and oriented.  Attention was good.   Language was appropriate.  Mood was normal without depression.  Speech was not pressured.  Thought content was not tangential.     LABORATORY/RADIOLOGY DATA:  Lab Results  Component Value Date   WBC 3.3* 10/12/2012   HGB 15.0 10/12/2012   HCT 44.0 10/12/2012   PLT 210 10/12/2012   GLUCOSE 86 10/12/2012   ALKPHOS 88 10/12/2012   ALT 23 10/12/2012   AST 19 10/12/2012   NA 138 10/12/2012  K 4.0 10/12/2012   CL 107 10/12/2012   CREATININE 0.9 10/12/2012   BUN 12.0 10/12/2012   CO2 23 10/12/2012   INR 1.04 08/16/2011   IMAGING:    *RADIOLOGY REPORT*  Clinical Data: Tongue cancer status post radiation and  chemotherapy.  CT NECK WITH CONTRAST  Technique: Multidetector CT imaging of the neck was performed with  intravenous contrast.    Contrast: OMNIPAQUE IOHEXOL 300 MG/ML SOLN  Comparison: PET scan 04/12/2012. CT of the neck 06/15/2011.  Findings: No residual or recurrent mass lesion is present at the  left tongue base. There is slight stranding of the left  parapharyngeal fat, likely secondary to post-treatment effects.  No focal mucosal or submucosal lesions are present. The vocal  cords are symmetric and within normal limits. The thyroid is  unremarkable. The lung apices are clear.  The mild chronic straining within the subcutaneous tissues and  platysma are compatible with prior radiation therapy. The  submandibular glands are shrunken.  No significant cervical adenopathy is present. There is there is  mild wall thickening about the carotid arteries, slightly more  prominent on the left.  Bone windows demonstrate stable appearance of a superior endplate  fracture at C5 and straightening of the normal cervical lordosis.  IMPRESSION:  1. No evidence for residual or recurrent tumor.  2. Post-therapy changes within the neck as described.  3. Stable spondylosis.  Original Report Authenticated By: Jamesetta Orleans. MATTERN, M.D.    ASSESSMENT AND PLAN:   1. History of oropharynx squamous cell carcinoma: I discussed with [name] and his fiance that he has no evidence of recurrent or metastatic disease on clinical history, physical exam, lab, and CT scan.  I recommended again watchful observation. Repeat Ct scan due in 6 months. 2. Calorie-protein malnutrition: resolved.  He is now taking his foods orally and has maintained his weight.   3. Depression/anxiety: Off Cymbalta. Admits to anxiety. I have offered a referral to SW or Psych. He has declined this today.  4. Slightly elevated ALT: Resolved. 5. Slightly elevated TSH: TSH is very close to normal. Will recheck in 6 months and if elevated will treat at that time.  6. Health maintenance: Flu vaccine administered today. 7. Follow up:  CT neck and RV with [place] in  about 6 months.  I advised him to also follow up with ENT and Rad Onc between visits with [place].    The length of time of the face-to-face encounter was 30 minutes. More than 50% of time was spent counseling and coordination of care.

## 2012-11-30 ENCOUNTER — Ambulatory Visit: Payer: Federal, State, Local not specified - PPO | Admitting: Radiation Oncology

## 2012-12-04 ENCOUNTER — Ambulatory Visit: Payer: Federal, State, Local not specified - PPO | Admitting: Radiation Oncology

## 2013-01-29 ENCOUNTER — Encounter: Payer: Self-pay | Admitting: Radiation Oncology

## 2013-01-29 ENCOUNTER — Ambulatory Visit
Admission: RE | Admit: 2013-01-29 | Discharge: 2013-01-29 | Disposition: A | Discharge status: Home / Self Care | Payer: Federal, State, Local not specified - PPO | Source: Ambulatory Visit | Attending: Radiation Oncology | Admitting: Radiation Oncology

## 2013-01-29 VITALS — BP 127/61 | HR 61 | Temp 97.4°F | Wt 186.5 lb

## 2013-01-29 DIAGNOSIS — C109 Malignant neoplasm of oropharynx, unspecified: Secondary | ICD-10-CM

## 2013-01-29 NOTE — Progress Notes (Signed)
Fu appt. today.  Denies any pain but does report tinnitus bilaterally for which he is taking Gingko.  Mouth is clear and moist.  States he has mild dry mouth.

## 2013-01-29 NOTE — Progress Notes (Signed)
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   He is doing very well. He underwent a CT scan of   his neck on 10/12/2012 which was negative. He is working at [employer]. His energy is good. His last TSH was slightly elevated and this continues to be followed by medical oncology. He denies any cold intolerance. He is able to swallow food fairly well but sometimes needs to wash it down with some extra water. He reports that his mouth is only a little dry. 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 He has no complaints in that respect. He does have tinnitus which she is grown use to it it is not that bothersome. He sees his dentist at least every 6 months and does comply with his fluoride treatments. Minimal complaints today. He continues to be followed regularly by his surgeon and medical oncologist.       He has a trip scheduled in the next week, a cruise with his family in the [place].               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  Meds: Current Outpatient Prescriptions  Medication Sig Dispense Refill  . B Complex-C (B-COMPLEX WITH VITAMIN C) tablet Take 1 tablet by mouth daily.      . ergocalciferol (VITAMIN D2) 50000 UNITS capsule Take 50,000 Units by mouth once a week.      . fish oil-omega-3 fatty acids 1000 MG capsule Take 1 g by mouth daily.      . Multiple Vitamins-Minerals (MULTIVITAMIN WITH MINERALS) tablet Take 1 tablet by mouth daily.      Marland Kitchen PREVIDENT 5000 DRY MOUTH 1.1 % PSTE          Physical Findings: The patient is in no acute distress. Patient is alert and oriented.  weight is 186 lb 8 oz (84.596 kg). His temperature is 97.4 F (36.3 C). His blood pressure is 127/61 and his pulse is 61. .  Well appearing. No acute distress. Oral cavity demonstrates moist mucous membranes with no lesions. No palpable tumor at the base of tongue bilaterally. No thrush. No oral pharyngeal lesions visible.  No  lymphedema in the neck. No palpable lymphadenopathy in the supraclavicular or cervical regions bilaterally.   Lab Findings: Lab Results  Component Value Date   WBC 3.3* 10/12/2012   HGB 15.0 10/12/2012   HCT 44.0 10/12/2012   MCV 89.9 10/12/2012   PLT 210 10/12/2012    CMP     Component Value Date/Time   NA 138 10/12/2012 1355   NA 144 04/12/2012 0804   K 4.0 10/12/2012 1355   K 4.2 04/12/2012 0804   CL 107 10/12/2012 1355   CL 109 04/12/2012 0804   CO2 23 10/12/2012 1355   CO2 29 04/12/2012 0804   GLUCOSE 86 10/12/2012 1355   GLUCOSE 83 04/12/2012 0804   BUN 12.0 10/12/2012 1355   BUN 9 04/12/2012 0804   CREATININE 0.9 10/12/2012 1355   CREATININE 0.92 04/12/2012  0804   CALCIUM 9.4 10/12/2012 1355   CALCIUM 8.9 04/12/2012 0804   PROT 6.7 10/12/2012 1355   PROT 6.3 04/12/2012 0804   ALBUMIN 4.2 10/12/2012 1355   ALBUMIN 4.3 04/12/2012 0804   AST 19 10/12/2012 1355   AST 18 04/12/2012 0804   ALT 23 10/12/2012 1355   ALT 22 04/12/2012 0804   ALKPHOS 88 10/12/2012 1355   ALKPHOS 61 04/12/2012 0804   BILITOT 0.80 10/12/2012 1355   BILITOT 0.4 04/12/2012 0804   GFRNONAA >60 08/20/2011 0456   GFRAA >60 08/20/2011 0456     Radiographic Findings: No results found.  Impression/Plan:    1) Head and Neck Cancer Status:  no evidence of disease clinically and radiographically   2) Nutritional Status: good, no issues   3) Risk Factors: The patient has been educated about risk factors including alcohol and tobacco abuse; they understand that avoidance of alcohol and  tobacco is important to prevent recurrences as well as other cancers  4) Swallowing: no issues   5) Dental: Encouraged to continue regular followup with dentistry, and dental hygiene including fluoride rinses.  he has been compliant.  6) Energy:  good, TSH will continue to be followed by medical oncology.   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 As for his tinnitus, if this worsens or is more bothersome, I told him that we can refer him back to audiology.  9) Follow-up in 6 months. The patient was encouraged to call with any issues or questions before then.  I spent 20 minutes minutes face to face with the patient and more than 50% of that time was spent in counseling and/or coordination of care. _____________________________________   Lonie Peak, MD

## 2013-01-30 DIAGNOSIS — C01 Malignant neoplasm of base of tongue: Secondary | ICD-10-CM | POA: Insufficient documentation

## 2013-04-08 ENCOUNTER — Ambulatory Visit (HOSPITAL_COMMUNITY)
Admission: RE | Admit: 2013-04-08 | Discharge: 2013-04-08 | Disposition: A | Discharge status: Home / Self Care | Payer: Federal, State, Local not specified - PPO | Source: Ambulatory Visit | Attending: Oncology | Admitting: Oncology

## 2013-04-08 ENCOUNTER — Encounter (HOSPITAL_COMMUNITY): Payer: Self-pay

## 2013-04-08 ENCOUNTER — Other Ambulatory Visit: Payer: Self-pay | Admitting: Lab

## 2013-04-08 ENCOUNTER — Other Ambulatory Visit (HOSPITAL_BASED_OUTPATIENT_CLINIC_OR_DEPARTMENT_OTHER): Payer: Federal, State, Local not specified - PPO

## 2013-04-08 DIAGNOSIS — C109 Malignant neoplasm of oropharynx, unspecified: Secondary | ICD-10-CM

## 2013-04-08 DIAGNOSIS — Z9221 Personal history of antineoplastic chemotherapy: Secondary | ICD-10-CM | POA: Insufficient documentation

## 2013-04-08 DIAGNOSIS — Z923 Personal history of irradiation: Secondary | ICD-10-CM | POA: Insufficient documentation

## 2013-04-08 DIAGNOSIS — C01 Malignant neoplasm of base of tongue: Secondary | ICD-10-CM

## 2013-04-08 DIAGNOSIS — M47812 Spondylosis without myelopathy or radiculopathy, cervical region: Secondary | ICD-10-CM | POA: Insufficient documentation

## 2013-04-08 LAB — COMPREHENSIVE METABOLIC PANEL (CC13)
ALT: 23 U/L (ref 0–55)
CO2: 26 mEq/L (ref 22–29)
Chloride: 106 mEq/L (ref 98–107)
Potassium: 3.9 mEq/L (ref 3.5–5.1)
Sodium: 141 mEq/L (ref 136–145)
Total Bilirubin: 0.48 mg/dL (ref 0.20–1.20)
Total Protein: 6.6 g/dL (ref 6.4–8.3)

## 2013-04-08 LAB — CBC WITH DIFFERENTIAL/PLATELET
BASO%: 0.4 % (ref 0.0–2.0)
LYMPH%: 18.9 % (ref 14.0–49.0)
MCHC: 34.1 g/dL (ref 32.0–36.0)
MONO#: 0.3 10*3/uL (ref 0.1–0.9)
RBC: 4.69 10*6/uL (ref 4.20–5.82)
RDW: 13 % (ref 11.0–14.6)
WBC: 3.6 10*3/uL — ABNORMAL LOW (ref 4.0–10.3)
lymph#: 0.7 10*3/uL — ABNORMAL LOW (ref 0.9–3.3)

## 2013-04-08 LAB — TSH: TSH: 3.138 u[IU]/mL (ref 0.350–4.500)

## 2013-04-08 MED ORDER — IOHEXOL 300 MG/ML  SOLN
100.0000 mL | Freq: Once | INTRAMUSCULAR | Status: AC | PRN
  Administered 2013-04-08: 100 mL via INTRAVENOUS

## 2013-04-10 ENCOUNTER — Ambulatory Visit (HOSPITAL_BASED_OUTPATIENT_CLINIC_OR_DEPARTMENT_OTHER): Payer: Federal, State, Local not specified - PPO | Admitting: Oncology

## 2013-04-10 ENCOUNTER — Telehealth: Payer: Self-pay | Admitting: Oncology

## 2013-04-10 VITALS — BP 111/66 | HR 68 | Temp 97.2°F | Resp 20 | Ht 73.0 in | Wt 187.3 lb

## 2013-04-10 DIAGNOSIS — C109 Malignant neoplasm of oropharynx, unspecified: Secondary | ICD-10-CM

## 2013-04-10 NOTE — Telephone Encounter (Signed)
GVE THE PT HIS April 2015 APPT CALENDAR °

## 2013-04-10 NOTE — Progress Notes (Signed)
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   PAST THERAPY: concurrent chemoXRT with daily XRT and q3wk Cisplatin finished in September 2012.   CURRENT THERAPY: watchful observation.  INTERVAL HISTORY: [name] 52 y.o. male returns for regular follow up with his fiance. He reports feeling well. he is working full time. He denies dysphagia, odynophagia, mucositis, xerostomia, palpable lymph node swelling, anorexia, weight loss, abdominal pain, bleeding symptom, bowel bladder incontinence, low back pain, skin rash. The rest of the 14 point review of system was negative. He denies smoking, chewing tobacco, or drinking alcohol heavily.   Past Medical History  Diagnosis Date  . Depression   . Carcinoma of oropharynx   . Mucositis   . Protein calorie malnutrition   . Status post radiation therapy 07/26/11 - 09/15/11    70 Gy in 35 fractions Base of Tongue and Bilateral neck  . Status post chemotherapy 08/2011    Cisplatin Q 3 weeks concurrent wih Radiation Therapy    No past surgical history on file.  Current Outpatient Prescriptions  Medication Sig Dispense Refill  . B Complex-C (B-COMPLEX WITH VITAMIN C) tablet Take 1 tablet by mouth daily.      . cetirizine (ZYRTEC) 10 MG tablet Take 10 mg by mouth daily.      . Cholecalciferol 5000 UNITS capsule Take 5,000 Units by mouth daily.      . fish oil-omega-3 fatty acids 1000 MG capsule Take 2 g by mouth daily.       . Ginkgo Biloba (GINKOBA PO) Take by mouth. Ginko Biloba Extract Capsules 30 mg, 2 capsules daily      . Multiple Vitamins-Minerals (MULTIVITAMIN GUMMIES ADULT PO) Take 2 Units by mouth daily.      Marland Kitchen PREVIDENT 5000 DRY MOUTH 1.1 % PSTE        No current facility-administered medications for this visit.    ALLERGIES:  is allergic to shellfish  allergy.  REVIEW OF SYSTEMS:  The rest of the 14-point review of system was negative.   Filed Vitals:   04/10/13 1408  BP: 111/66  Pulse: 68  Temp: 97.2 F (36.2 C)  Resp: 20   Wt Readings from Last 3 Encounters:  04/10/13 187 lb 4.8 oz (84.959 kg)  01/29/13 186 lb 8 oz (84.596 kg)  10/17/12 185 lb 11.2 oz (84.233 kg)   ECOG Performance status: 0  PHYSICAL EXAMINATION:  General:  well-nourished man, in no acute distress.  Eyes:  no scleral icterus.  ENT:  There were no oropharyngeal lesions.  Neck was without thyromegaly.  Lymphatics:  Negative cervical, supraclavicular or axillary adenopathy.  Respiratory: lungs were clear bilaterally without wheezing or crackles.  Cardiovascular:  Regular rate and rhythm, S1/S2, without murmur, rub or gallop.  There was no pedal edema.  GI:  abdomen was soft, flat, nontender, nondistended, without organomegaly.  Muscoloskeletal:  no spinal tenderness of palpation of vertebral spine.  Skin exam was without echymosis, petichae.  Neuro exam was nonfocal.  Patient was able to get on and off exam table without assistance.  Gait was normal.  Patient was alert and oriented.  Attention was good.   Language was appropriate.  Mood was normal without depression.  Speech was not pressured.  Thought content was not tangential.     LABORATORY/RADIOLOGY DATA:  Lab Results  Component Value Date   WBC 3.6* 04/08/2013   HGB 14.3 04/08/2013   HCT 41.7 04/08/2013   PLT 223 04/08/2013   GLUCOSE 89 04/08/2013   ALKPHOS 70 04/08/2013   ALT 23 04/08/2013   AST 17 04/08/2013   NA 141 04/08/2013   K 3.9 04/08/2013   CL 106 04/08/2013   CREATININE 0.9 04/08/2013   BUN 11.3 04/08/2013   CO2 26 04/08/2013   INR 1.04 08/16/2011   IMAGING:  I personally reviewed the following CT scan performed on 04/08/2013 and showed the pictures to the patient and his fiance.   Findings: There is no evidence of residual tumor at the left base  of tongue. Previously noted left parapharyngeal fat  stranding is  improved, now essentially normal. No pathologic adenopathy.  Craniocervical vasculature patent. Small hyperenhancing  submandibular glands reflect post XRT changes. Normal parotid  glands. No significant periodontal disease or mandibular necrosis.  Mild spondylosis C5-C6. Airway midline. No retropharyngeal  collections. Normal spinal canal. Normal larynx and subglottic  region. No supraglottic fat allergy. Clear lung apices.  IMPRESSION:  No evidence for residual or recurrent tumor. Unremarkable CT neck  with contrast. Overall improved appearance compared with  10/12/2012.    ASSESSMENT AND PLAN:   1. History of oropharynx squamous cell carcinoma: Continue to be in remission. I advised him to refrain from smoking, chewing tobacco, or drinking alcohol excessively. 2. History of calorie-protein malnutrition: resolved.  He is now taking his foods orally and has maintained his weight.   3. History of depression/anxiety: Resolved off of therapy for head and neck cancer. He does not take any medication for now history of depression.  4. Follow up:  In about one year with medical oncology. I advised him to follow with ENT and radiation oncology as well. I informed the patient that I leaving the practice in 3 months. A new physician will take over his care when he is seen one year. The patient has the option of choosing a new physician in the practice or be referred to an outside facility if he wishes to do so.   The length of time of the face-to-face encounter was 15 minutes. More than 50% of time was spent counseling and coordination of care.       Huan T. Gaylyn Rong, M.D.

## 2013-04-10 NOTE — Patient Instructions (Addendum)
1.  Diagnosis:  History of head and neck cancer. 2.  Status:  No recurrent or metastatic disease.  3.  Follow up:  In about 1 year with medical oncology.  Please also follow up with RadOnc and Ear/Nose/Throat.

## 2013-04-17 ENCOUNTER — Ambulatory Visit (HOSPITAL_COMMUNITY): Payer: Self-pay

## 2013-04-17 ENCOUNTER — Other Ambulatory Visit: Payer: Self-pay | Admitting: Lab

## 2013-04-19 ENCOUNTER — Ambulatory Visit: Payer: Self-pay | Admitting: Oncology

## 2013-08-02 ENCOUNTER — Ambulatory Visit: Payer: Federal, State, Local not specified - PPO | Admitting: Radiation Oncology

## 2013-08-09 ENCOUNTER — Ambulatory Visit: Payer: Federal, State, Local not specified - PPO | Admitting: Radiation Oncology

## 2013-08-16 ENCOUNTER — Ambulatory Visit
Admission: RE | Admit: 2013-08-16 | Discharge: 2013-08-16 | Disposition: A | Discharge status: Home / Self Care | Payer: Federal, State, Local not specified - PPO | Source: Ambulatory Visit | Attending: Radiation Oncology | Admitting: Radiation Oncology

## 2013-08-16 ENCOUNTER — Encounter: Payer: Self-pay | Admitting: Radiation Oncology

## 2013-08-16 VITALS — BP 109/74 | HR 74 | Temp 98.3°F | Ht 73.0 in | Wt 188.8 lb

## 2013-08-16 DIAGNOSIS — K117 Disturbances of salivary secretion: Secondary | ICD-10-CM | POA: Insufficient documentation

## 2013-08-16 DIAGNOSIS — Z923 Personal history of irradiation: Secondary | ICD-10-CM | POA: Insufficient documentation

## 2013-08-16 DIAGNOSIS — Z85810 Personal history of malignant neoplasm of tongue: Secondary | ICD-10-CM | POA: Insufficient documentation

## 2013-08-16 DIAGNOSIS — Z09 Encounter for follow-up examination after completed treatment for conditions other than malignant neoplasm: Secondary | ICD-10-CM | POA: Insufficient documentation

## 2013-08-16 DIAGNOSIS — J029 Acute pharyngitis, unspecified: Secondary | ICD-10-CM | POA: Insufficient documentation

## 2013-08-16 DIAGNOSIS — C01 Malignant neoplasm of base of tongue: Secondary | ICD-10-CM

## 2013-08-16 MED ORDER — LARYNGOSCOPY SOLUTION RAD-ONC
15.0000 mL | Freq: Once | TOPICAL | Status: AC
  Administered 2013-08-16: 15 mL via TOPICAL
  Filled 2013-08-16: qty 15

## 2013-08-16 NOTE — Progress Notes (Signed)
[name] here for fu appt S/P radiation for Base of Tongue Cancer which completed on 09/15/11.  He states he is eating anything he wants, but still has dry mouth after awakening in the am.  He visits the dentist every three months and has a bridge on the bottom.

## 2013-08-16 NOTE — Progress Notes (Signed)
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  He states he is eating anything he wants, but still has dry mouth after awakening in the am. He visits the dentist every three months and has a bridge on the bottom.  Has had a sore throat for ~ 1 week.  Thinks it may be from post nasal drip. No ear pain or other symptoms.  Good energy, no cold intolerance. Saw Dr Jenne Pane ~26mo ago.  Sees Med/onc in April 2015.                        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  Meds: Current Outpatient Prescriptions  Medication Sig Dispense Refill  . B Complex-C (B-COMPLEX WITH VITAMIN C) tablet Take 1 tablet by mouth daily.      . cetirizine (ZYRTEC) 10 MG tablet Take 10 mg by mouth daily.      . Cholecalciferol 5000 UNITS capsule Take 5,000 Units by mouth daily.      . fish oil-omega-3 fatty acids 1000 MG capsule Take 2 g by mouth daily.       . Ginkgo Biloba (GINKOBA PO) Take by mouth. Ginko Biloba Extract Capsules 30 mg, 2 capsules daily      . Multiple Vitamins-Minerals (MULTIVITAMIN GUMMIES ADULT PO) Take 2 Units by mouth daily.      Marland Kitchen PREVIDENT 5000 DRY MOUTH 1.1 % PSTE        No current facility-administered medications for this encounter.    Physical Findings: The patient is in no acute distress. Patient is alert and oriented.  height is 6\' 1"  (1.854 m) and weight is 188 lb 12.8 oz (85.639 kg). His temperature is 98.3 F (36.8 C). His blood pressure is 109/74 and his pulse is 74. Marland Kitchen  NAD.  Mucosa over soft palate is a little  erythematous/blotchy, but otherwise no visible lesions in oral cavity/pharynx.  Neck - no cervical or SCV adenopathy palpated. Laryngoscopy - no lesions over BOT/pharynx/larynx.  Lab Findings: Lab Results  Component Value Date   WBC 3.6* 04/08/2013   HGB 14.3 04/08/2013   HCT 41.7 04/08/2013   MCV 89.0 04/08/2013   PLT 223 04/08/2013    Radiographic Findings: No results found.  Impression/Plan:    1) Head and Neck Cancer Status: no evidence of disease  2) Nutritional Status: good, no issues   3) Risk Factors: The patient has been educated about risk factors including alcohol and tobacco abuse; they understand that avoidance of alcohol and tobacco is important to prevent recurrences as well as other cancers. He continue to drink a "little beer" but no tobacco use.  4) Swallowing: no issues   5) Dental: Encouraged to continue regular followup with dentistry, and dental hygiene including fluoride rinses. he has been compliant. He sees his dentist q91mo.  6) Energy: good, TSH will continue to be followed by medical oncology. Last TSH was nL in April.  7) Social: No active social issues to address at this time   8)  Other: continue followup with surgeon and medical oncology, patient has been compliant. Has had a sore throat for 1 week. I do not see anything concerning on laryngoscopy/ physical exam today. However, I recommended he call Dr. Jenne Pane for a more thorough laryngoscopy if sore throat persists for 2 more weeks.   9) Follow-up in ~6 months. The patient was encouraged to call with any issues or questions before then.  I spent 25 minutes minutes face to face with the patient and more than 50% of that time was spent in counseling and/or coordination of care. _____________________________________   Lonie Peak, MD

## 2013-08-16 NOTE — Addendum Note (Signed)
Encounter addended by: Delynn Flavin, RN on: 08/16/2013 12:22 PM<BR>     Documentation filed: Demographics Visit

## 2013-10-08 ENCOUNTER — Telehealth: Payer: Self-pay | Admitting: Oncology

## 2013-10-08 NOTE — Telephone Encounter (Signed)
pt came in and wanted to r/s 2015 appts to Dec 2014...lvm for desk nurse to get orders for sooner appt.

## 2013-10-09 ENCOUNTER — Other Ambulatory Visit: Payer: Self-pay | Admitting: *Deleted

## 2013-10-09 ENCOUNTER — Telehealth: Payer: Self-pay | Admitting: Hematology and Oncology

## 2013-10-09 NOTE — Telephone Encounter (Signed)
Returned pt's call re when next appt is. Moved 4/16 appt from CP2 to NG and gv pt appt for lb/NG 4/16 @ 1:30pm.

## 2013-10-10 ENCOUNTER — Telehealth: Payer: Self-pay | Admitting: Hematology and Oncology

## 2013-10-10 ENCOUNTER — Encounter: Payer: Self-pay | Admitting: Internal Medicine

## 2013-10-10 NOTE — Telephone Encounter (Signed)
cell=busy and home= no vm available....mailed pt letter and avs

## 2013-10-21 ENCOUNTER — Telehealth: Payer: Self-pay | Admitting: *Deleted

## 2013-10-21 ENCOUNTER — Telehealth: Payer: Self-pay | Admitting: Hematology and Oncology

## 2013-10-21 NOTE — Telephone Encounter (Signed)
Patient's wife called to cancel follow up appointment in March 2015 with Dr. Basilio Cairo.  She said they were moving their care to Dr. France Ravens Porisnicu at [facility].  Notified Dr. Basilio Cairo

## 2013-10-21 NOTE — Telephone Encounter (Signed)
There was a misunderstanding, patient now realizes Dr. Hilary Hertz is a Systems developer.    Patient will keep follow up appointment with Dr. Basilio Cairo for 02/28/14.  Confirmed with patient.

## 2013-10-21 NOTE — Telephone Encounter (Signed)
pt called to cx appt he is getting seen at [facility].

## 2013-10-24 ENCOUNTER — Ambulatory Visit: Payer: Self-pay | Admitting: Hematology and Oncology

## 2013-10-31 ENCOUNTER — Other Ambulatory Visit: Payer: Self-pay

## 2013-11-19 ENCOUNTER — Ambulatory Visit (AMBULATORY_SURGERY_CENTER): Payer: Self-pay

## 2013-11-19 VITALS — Ht 74.0 in | Wt 194.2 lb

## 2013-11-19 DIAGNOSIS — Z1211 Encounter for screening for malignant neoplasm of colon: Secondary | ICD-10-CM

## 2013-11-19 MED ORDER — NA SULFATE-K SULFATE-MG SULF 17.5-3.13-1.6 GM/177ML PO SOLN: ORAL | Status: DC

## 2013-11-25 ENCOUNTER — Encounter: Payer: Self-pay | Admitting: Internal Medicine

## 2013-11-29 ENCOUNTER — Ambulatory Visit (AMBULATORY_SURGERY_CENTER): Payer: Federal, State, Local not specified - PPO | Admitting: Internal Medicine

## 2013-11-29 ENCOUNTER — Encounter: Payer: Self-pay | Admitting: Internal Medicine

## 2013-11-29 VITALS — BP 107/71 | HR 59 | Temp 97.6°F | Resp 16 | Ht 74.0 in | Wt 194.0 lb

## 2013-11-29 DIAGNOSIS — Z1211 Encounter for screening for malignant neoplasm of colon: Secondary | ICD-10-CM

## 2013-11-29 MED ORDER — SODIUM CHLORIDE 0.9 % IV SOLN: 500.0000 mL | INTRAVENOUS | Status: DC

## 2013-11-29 NOTE — Patient Instructions (Addendum)
No polyps or cancer were seen. You do have a condition called diverticulosis - common and not usually a problem. Please read the handout provided.  Next routine colonoscopy in 10 years - 2024.  I appreciate the opportunity to care for you. Iva Boop, MD, FACG  YOU HAD AN ENDOSCOPIC PROCEDURE TODAY AT THE [facility]: Refer to the procedure report that was given to you for any specific questions about what was found during the examination.  If the procedure report does not answer your questions, please call your gastroenterologist to clarify.  If you requested that your care partner not be given the details of your procedure findings, then the procedure report has been included in a sealed envelope for you to review at your convenience later.  YOU SHOULD EXPECT: Some feelings of bloating in the abdomen. Passage of more gas than usual.  Walking can help get rid of the air that was put into your GI tract during the procedure and reduce the bloating. If you had a lower endoscopy (such as a colonoscopy or flexible sigmoidoscopy) you may notice spotting of blood in your stool or on the toilet paper. If you underwent a bowel prep for your procedure, then you may not have a normal bowel movement for a few days.  DIET: Your first meal following the procedure should be a light meal and then it is ok to progress to your normal diet.  A half-sandwich or bowl of soup is an example of a good first meal.  Heavy or fried foods are harder to digest and may make you feel nauseous or bloated.  Likewise meals heavy in dairy and vegetables can cause extra gas to form and this can also increase the bloating.  Drink plenty of fluids but you should avoid alcoholic beverages for 24 hours.  ACTIVITY: Your care partner should take you home directly after the procedure.  You should plan to take it easy, moving slowly for the rest of the day.  You can resume normal activity the day after the procedure  however you should NOT DRIVE or use heavy machinery for 24 hours (because of the sedation medicines used during the test).    SYMPTOMS TO REPORT IMMEDIATELY: A gastroenterologist can be reached at any hour.  During normal business hours, 8:30 AM to 5:00 PM Monday through Friday, call [phone].  After hours and on weekends, please call the GI answering service at [phone] who will take a message and have the physician on call contact you.   Following lower endoscopy (colonoscopy or flexible sigmoidoscopy):  Excessive amounts of blood in the stool  Significant tenderness or worsening of abdominal pains  Swelling of the abdomen that is new, acute  Fever of 100F or higher  FOLLOW UP: If any biopsies were taken you will be contacted by phone or by letter within the next 1-3 weeks.  Call your gastroenterologist if you have not heard about the biopsies in 3 weeks.  Our staff will call the home number listed on your records the next business day following your procedure to check on you and address any questions or concerns that you may have at that time regarding the information given to you following your procedure. This is a courtesy call and so if there is no answer at the home number and we have not heard from you through the emergency physician on call, we will assume that you have returned to your regular daily activities without  incident.  SIGNATURES/CONFIDENTIALITY: You and/or your care partner have signed paperwork which will be entered into your electronic medical record.  These signatures attest to the fact that that the information above on your After Visit Summary has been reviewed and is understood.  Full responsibility of the confidentiality of this discharge information lies with you and/or your care-partner.  Diverticulosis-handout given  Repeat colonoscopy in 10 years.

## 2013-11-29 NOTE — Progress Notes (Signed)
Procedure ends, to recovery, report given and VSS. 

## 2013-11-29 NOTE — Op Note (Signed)
[facility] [address] [place], [address]   COLONOSCOPY PROCEDURE REPORT  PATIENT: [name], [name]  MR#: [id number] BIRTHDATE: [birth year] , 52  yrs. old GENDER: Male ENDOSCOPIST: Iva Boop, MD, [facility] REFERRED NF:AOZHY Spear, M.D. PROCEDURE DATE:  11/29/2013 PROCEDURE:   Colonoscopy, screening First Screening Colonoscopy - Avg.  risk and is 50 yrs.  old or older Yes.  Prior Negative Screening - Now for repeat screening. N/A  History of Adenoma - Now for follow-up colonoscopy & has been > or = to 3 yrs.  N/A  Polyps Removed Today? No.  Recommend repeat exam, <10 yrs? No. ASA CLASS:   Class II INDICATIONS:average risk screening and first colonoscopy. MEDICATIONS: propofol (Diprivan) 300mg  IV, MAC sedation, administered by CRNA, and These medications were titrated to patient response per physician's verbal order  DESCRIPTION OF PROCEDURE:   After the risks benefits and alternatives of the procedure were thoroughly explained, informed consent was obtained.  A digital rectal exam revealed no abnormalities of the rectum, A digital rectal exam revealed no prostatic nodules, and A digital rectal exam revealed the prostate was not enlarged.   The LB [id number]  endoscope was introduced through the anus and advanced to the cecum, which was identified by both the appendix and ileocecal valve. No adverse events experienced.   The quality of the prep was excellent using Suprep  The instrument was then slowly withdrawn as the colon was fully examined.      COLON FINDINGS: Mild diverticulosis was noted in the sigmoid colon. The colon mucosa was otherwise normal.   A right colon retroflexion was performed.  Retroflexed views revealed no abnormalities. The time to cecum=2 minutes 39 seconds.  Withdrawal time=8 minutes 17 seconds.  The scope was withdrawn and the procedure completed. COMPLICATIONS: There were no complications.  ENDOSCOPIC IMPRESSION: 1.   Mild  diverticulosis was noted in the sigmoid colon 2.   The colon mucosa was otherwise normal with excellent prep- first screening  RECOMMENDATIONS: Repeat colonoscopy 10 years - 2024  eSigned:  Iva Boop, MD, [facility] 11/29/2013 2:14 PM  cc: Herb Grays, MD and The Patient

## 2013-11-29 NOTE — Progress Notes (Signed)
Patient did not experience any of the following events: a burn prior to discharge; a fall within the facility; wrong site/side/patient/procedure/implant event; or a hospital transfer or hospital admission upon discharge from the facility. (G8907) Patient did not have preoperative order for IV antibiotic SSI prophylaxis. (G8918)  

## 2013-12-02 ENCOUNTER — Telehealth: Payer: Self-pay

## 2013-12-02 NOTE — Telephone Encounter (Signed)
  Follow up Call-  Call back number 11/29/2013  Post procedure Call Back phone  # [phone]  Permission to leave phone message Yes     Patient questions:  Do you have a fever, pain , or abdominal swelling? no Pain Score  0 *  Have you tolerated food without any problems? yes  Have you been able to return to your normal activities? yes  Do you have any questions about your discharge instructions: Diet   no Medications  no Follow up visit  no  Do you have questions or concerns about your Care? no  Actions: * If pain score is 4 or above: No action needed, pain <4.  No problems per the pt. Maw

## 2014-02-28 ENCOUNTER — Ambulatory Visit: Payer: Federal, State, Local not specified - PPO | Admitting: Radiation Oncology

## 2014-02-28 ENCOUNTER — Ambulatory Visit
Admission: RE | Admit: 2014-02-28 | Discharge: 2014-02-28 | Disposition: A | Discharge status: Home / Self Care | Payer: Federal, State, Local not specified - PPO | Source: Ambulatory Visit | Attending: Radiation Oncology | Admitting: Radiation Oncology

## 2014-02-28 ENCOUNTER — Encounter: Payer: Self-pay | Admitting: Radiation Oncology

## 2014-02-28 VITALS — BP 116/77 | HR 71 | Temp 97.5°F | Ht 74.0 in | Wt 193.4 lb

## 2014-02-28 DIAGNOSIS — C01 Malignant neoplasm of base of tongue: Secondary | ICD-10-CM

## 2014-02-28 NOTE — Progress Notes (Signed)
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     He is seeing Dr. Melven Sartorius at [facility] for followups in med/onc as Dr Lamonte Sakai is no longer here.  He has a final surveillance CT scan in May.  CT imaging of Neck/ chest at [facility] on 11-04-13 was unremarkable.   He denies any pain or difficulty swallowing. Oral mucosa is moist. No recurrent neck edema.  Sees his dentist q 60mo.  Energy is good, he is working. TSH was normal (2.73) at [facility] on 10-08-13.                       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  Meds: Current Outpatient Prescriptions  Medication Sig Dispense Refill  . B Complex-C (B-COMPLEX WITH VITAMIN C) tablet Take 1 tablet by mouth daily.      . cetirizine (ZYRTEC) 10 MG tablet Take 10 mg by mouth daily.      . Cholecalciferol 5000 UNITS capsule Take 5,000 Units by mouth daily.      . fish oil-omega-3 fatty acids 1000 MG capsule Take 2 g by mouth daily.       . Ginkgo Biloba (GINKOBA PO) Take by mouth. Ginko Biloba Extract Capsules 30 mg, 2 capsules daily      . Multiple Vitamins-Minerals (MULTIVITAMIN GUMMIES ADULT PO) Take 2 Units by mouth daily.      Marland Kitchen PREVIDENT 5000 DRY MOUTH 1.1 % PSTE        No current facility-administered medications for this encounter.    Physical Findings: The patient is in no acute distress. Patient is alert and oriented.  height is 6\' 2"  (1.88 m) and weight is 193 lb 6.4 oz (87.726 kg). His temperature is 97.5 F (36.4 C). His blood pressure is 116/77 and his pulse is 71. .  General: Alert and  oriented, in no acute distress HEENT: Head is normocephalic.  Oropharynx is clear. Mucosa is moist and intact. Neck: Neck is supple, no palpable cervical or supraclavicular lymphadenopathy. No edema.   Lab Findings: Lab Results  Component Value Date   WBC 3.6* 04/08/2013   HGB 14.3 04/08/2013   HCT 41.7 04/08/2013   MCV 89.0 04/08/2013   PLT 223 04/08/2013    Lab Results  Component Value Date   TSH 3.138 04/08/2013   TSH in Oct was nL at [facility].  Radiographic Findings: No results found.  Impression/Plan:    1) Head and Neck Cancer Status: no evidence of disease   2) Nutritional Status: good, no issues   3) Risk Factors: The patient has been educated about risk factors including alcohol and tobacco abuse; they understand that avoidance of alcohol and tobacco is important to prevent recurrences as well as other cancers. He continue to drink a "little beer" but no tobacco use.   4) Swallowing: no issues   5) Dental: Encouraged to continue regular followup with dentistry, and dental hygiene including fluoride rinses. he has been compliant. He sees his dentist q75mo.   6) Energy: good, TSH  normal  7) Social: No active social issues to address at this time   8) Other: f/u with ENT Redmond Baseman)  in April  9) Follow-up with me in ~6 months. The patient was encouraged to call with any issues or questions before then.     I spent 20 minutes minutes face to face with the patient and more than 50% of that time was spent in counseling and/or coordination of care. _____________________________________   Eppie Gibson, MD

## 2014-02-28 NOTE — Progress Notes (Signed)
[name] here today for reassessment s/p radiation to the left base of tongue which completed in 2012.  He denies any pain or difficulty swallowing. Oral mucosa is moist and the skin in the prior field is intact and soft.

## 2014-04-10 ENCOUNTER — Other Ambulatory Visit: Payer: Self-pay | Admitting: Lab

## 2014-04-10 ENCOUNTER — Ambulatory Visit: Payer: Self-pay | Admitting: Hematology and Oncology

## 2014-09-02 ENCOUNTER — Ambulatory Visit: Payer: Federal, State, Local not specified - PPO | Admitting: Radiation Oncology

## 2014-09-03 ENCOUNTER — Ambulatory Visit
Admission: RE | Admit: 2014-09-03 | Discharge: 2014-09-03 | Disposition: A | Discharge status: Home / Self Care | Payer: Federal, State, Local not specified - PPO | Source: Ambulatory Visit | Attending: Radiation Oncology | Admitting: Radiation Oncology

## 2014-09-03 ENCOUNTER — Encounter: Payer: Self-pay | Admitting: Radiation Oncology

## 2014-09-03 VITALS — BP 116/75 | HR 57 | Temp 97.9°F | Resp 20 | Ht 74.0 in | Wt 193.5 lb

## 2014-09-03 DIAGNOSIS — F329 Major depressive disorder, single episode, unspecified: Secondary | ICD-10-CM

## 2014-09-03 DIAGNOSIS — C01 Malignant neoplasm of base of tongue: Secondary | ICD-10-CM

## 2014-09-03 DIAGNOSIS — F32A Depression, unspecified: Secondary | ICD-10-CM

## 2014-09-03 LAB — TSH CHCC: TSH: 3.291 m[IU]/L (ref 0.320–4.118)

## 2014-09-03 NOTE — Progress Notes (Signed)
Follow up[ s/p rdiatin treatmenr completed 08/28/11 base of tongue, no c/o pain, nausea, swallowing difficultuies, last seen Dr. Redmond Baseman 09/24/13, last TSH 2.730 10/08/13 at [facility],  Appetite good,  11:24 AM

## 2014-09-03 NOTE — Progress Notes (Signed)
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  Energy, swallowing, dental hygiene, salivary function all good.  Had clear CT scans of neck/chest in May at [facility] and was discharged from Dr Porosnicu's clinic.                         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  Meds: Current Outpatient Prescriptions  Medication Sig Dispense Refill  . B Complex-C (B-COMPLEX WITH VITAMIN C) tablet Take 1 tablet by mouth daily.      . cetirizine (ZYRTEC) 10 MG tablet Take 10 mg by mouth daily.      . Cholecalciferol 5000 UNITS capsule Take 5,000 Units by mouth daily.      . fish oil-omega-3 fatty acids 1000 MG capsule Take 2 g by mouth daily.       . Ginkgo Biloba (GINKOBA PO) Take by mouth. Ginko Biloba Extract Capsules 30 mg, 2 capsules daily      . Multiple Vitamins-Minerals (MULTIVITAMIN GUMMIES ADULT PO) Take 2 Units by mouth daily.      Marland Kitchen PREVIDENT 5000 DRY MOUTH 1.1 % PSTE        No current facility-administered medications for this encounter.    Physical Findings: The patient is in no acute distress. Patient is alert and oriented.  height is 6\' 2"  (1.88 m) and weight is 193 lb 8 oz (87.771 kg). His oral temperature is 97.9 F (36.6 C). His blood pressure is 116/75 and his pulse is 57. His respiration is 20. Marland Kitchen  Oropharyngeal mucosa is intact with no thrush or lesions. No palpable cervical or supraclavicular lymphadenopathy. Skin intact and smooth over neck.  LUNGS CTAB. HEART RRR.   Lab  Findings: Lab Results  Component Value Date   WBC 3.6* 04/08/2013   HGB 14.3 04/08/2013   HCT 41.7 04/08/2013   MCV 89.0 04/08/2013   PLT 223 04/08/2013    Lab Results  Component Value Date   TSH 3.291 09/03/2014    Radiographic Findings: As above  Impression/Plan:    1) Head and Neck Cancer Status: no evidence of disease   2) Nutritional Status: good, no issues   3) Risk Factors: The patient has been educated about risk factors including alcohol and tobacco abuse; they understand that avoidance of alcohol and tobacco is important to prevent recurrences as well as other cancers. He continue to drink a "little beer" but no tobacco use.   4) Swallowing: no issues   5) Dental: Encouraged to continue regular followup with dentistry, and dental hygiene including fluoride rinses. he has been compliant. He sees his dentist q9mo.   6) Energy: good, TSH normal today  7) Social: No active social issues to address at this time   8) At year years from completion of treatment, we can space out followups so that he sees rad/onc and ENT each once a year.  I will scheduled followup in 1 year, and ask Gayleen Orem, RN, our Head and Neck Oncology  Navigator to arrange f/u with Dr Redmond Baseman of ENT in 6 months, so our followups are staggered q 6 months.  I will recheck TSH at his f/u in 1 year.    _____________________________________   Eppie Gibson, MD

## 2014-09-04 ENCOUNTER — Telehealth: Payer: Self-pay | Admitting: *Deleted

## 2014-09-04 NOTE — Telephone Encounter (Signed)
Spoke with Anderson Malta at [facility].  Relayed Dr. Pearlie Oyster request for patient to have 92-month follow-up appt with Dr. Redmond Baseman.  She verbalized understanding.  Gayleen Orem, RN, BSN, [place] [facility] [place] [phone]

## 2015-01-22 ENCOUNTER — Telehealth: Payer: Self-pay | Admitting: Internal Medicine

## 2015-01-22 NOTE — Telephone Encounter (Signed)
Close encounter 

## 2015-09-02 ENCOUNTER — Ambulatory Visit (HOSPITAL_BASED_OUTPATIENT_CLINIC_OR_DEPARTMENT_OTHER)
Admission: RE | Admit: 2015-09-02 | Discharge: 2015-09-02 | Disposition: A | Discharge status: Home / Self Care | Payer: Federal, State, Local not specified - PPO | Source: Ambulatory Visit | Attending: Radiation Oncology | Admitting: Radiation Oncology

## 2015-09-02 ENCOUNTER — Telehealth: Payer: Self-pay | Admitting: Adult Health

## 2015-09-02 ENCOUNTER — Other Ambulatory Visit: Payer: Self-pay | Admitting: Radiation Oncology

## 2015-09-02 ENCOUNTER — Ambulatory Visit
Admission: RE | Admit: 2015-09-02 | Discharge: 2015-09-02 | Disposition: A | Discharge status: Home / Self Care | Payer: Federal, State, Local not specified - PPO | Source: Ambulatory Visit | Attending: Radiation Oncology | Admitting: Radiation Oncology

## 2015-09-02 ENCOUNTER — Other Ambulatory Visit: Payer: Self-pay | Admitting: Adult Health

## 2015-09-02 VITALS — BP 117/82 | HR 64 | Temp 98.3°F | Resp 12 | Wt 200.0 lb

## 2015-09-02 DIAGNOSIS — R635 Abnormal weight gain: Secondary | ICD-10-CM

## 2015-09-02 DIAGNOSIS — F32A Depression, unspecified: Secondary | ICD-10-CM

## 2015-09-02 DIAGNOSIS — C01 Malignant neoplasm of base of tongue: Secondary | ICD-10-CM

## 2015-09-02 DIAGNOSIS — F329 Major depressive disorder, single episode, unspecified: Secondary | ICD-10-CM

## 2015-09-02 LAB — T4, FREE: Free T4: 1.04 ng/dL (ref 0.80–1.80)

## 2015-09-02 LAB — TSH CHCC: TSH: 4.967 m(IU)/L — ABNORMAL HIGH (ref 0.320–4.118)

## 2015-09-02 NOTE — Progress Notes (Signed)
Pain issues, if any: Constant dull ache to right side of neck, constantly.  He reports he can feel a 1 inch knot at the base of his skull.  He reports it has been present for the last 6 months.  Using a feeding tube?: no removed  Weight changes, if any: Wt Readings from Last 3 Encounters:  09/02/15 200 lb (90.719 kg)  09/03/14 193 lb 8 oz (87.771 kg)  02/28/14 193 lb 6.4 oz (87.726 kg)   Swallowing issues, if any: no Smoking or chewing tobacco? no Using fluoride trays daily? yes Last ENT visit was on: 03/02/15 Dr. Redmond Baseman Other notable issues, if any: Other than the "knot" no.   Vital Signs: BP 117/82 mmHg  Pulse 64  Temp(Src) 98.3 F (36.8 C) (Oral)  Resp 12  Wt 200 lb (90.719 kg)  SpO2 100%

## 2015-09-02 NOTE — Telephone Encounter (Signed)
I attempted to reach [name] at the request of Dr. Isidore Moos regarding his transition to survivorship.  He will see me in 1 year (09/01/16) for his 5-year survivorship "graduation" visit.  I attempted to reach him via phone to discuss his scheduled appts in 1 year, but was unable to leave a message.    I will mail a copy of his appt calendar, as well as a letter & resources detailing the purpose of survivorship and I will be a part of his care.  I look forward to participating in his care.    [Note: I scheduled his visit with me, ordered labs, and lab appt in EPIC].   Jason Craze, NP [place] [phone]

## 2015-09-02 NOTE — Progress Notes (Signed)
Called Dr. Redmond Baseman office to inquire about recording for this patient.  Left a message with [facility].

## 2015-09-02 NOTE — Progress Notes (Signed)
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  New daughter, 61 mo old. Has a dull ache to right side of neck, constantly.  He reports he can feel a 1 inch knot at the base of his skull.  He reports it has been present for the last 6 months.   No Tobacco.     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  Meds: Current Outpatient Prescriptions  Medication Sig Dispense Refill  . B Complex-C (B-COMPLEX WITH VITAMIN C) tablet Take 1 tablet by mouth daily.    . cetirizine (ZYRTEC) 10 MG tablet Take 10 mg by mouth daily.    . Cholecalciferol 5000 UNITS capsule Take 5,000 Units by mouth daily.    . fish oil-omega-3 fatty acids 1000 MG capsule Take 2 g by mouth daily.     . Ginkgo Biloba (GINKOBA PO) Take by mouth. Ginko Biloba Extract Capsules 30 mg, 2 capsules daily    . Multiple Vitamins-Minerals (MULTIVITAMIN GUMMIES ADULT PO) Take 2 Units by mouth daily.    Marland Kitchen PREVIDENT 5000 DRY MOUTH 1.1 % PSTE      No current facility-administered medications for this encounter.    Physical Findings: The patient is in no acute distress. Patient is alert and oriented.  weight is 200 lb (90.719 kg). His oral temperature is 98.3 F (36.8 C). His blood pressure is 117/82 and his pulse is 64. His respiration is 12 and oxygen saturation is 100%. .  Oropharyngeal mucosa is intact with no thrush or lesions. No palpable cervical or supraclavicular lymphadenopathy. I cannot appreciate any mass over occipital region/posterior right neck where patient reports a "knot". Skin intact and smooth over  neck.  LUNGS CTAB. HEART RRR.    Lab Findings: Lab Results  Component Value Date   WBC 3.6* 04/08/2013   HGB 14.3 04/08/2013   HCT 41.7 04/08/2013   MCV 89.0 04/08/2013   PLT 223 04/08/2013    Lab Results  Component Value Date   TSH 4.967* 09/02/2015    Radiographic Findings: As above  Impression/Plan:    1) Head and Neck Cancer Status: no evidence of disease   I cannot appreciate any mass over occipital region/posterior neck where patient reports a "knot". Suspect muscle tightness. Pt reports h/o neck injury years ago. Patient will let Dr Redmond Baseman know if this worsens, so f/u can be moved up. Told that PT is also an option for pain relief.  2) Nutritional Status: good, no issues   3) Risk Factors: The patient has been educated about risk factors including alcohol and tobacco abuse; they understand that avoidance of alcohol and tobacco is important to prevent recurrences as well as other cancers. Abstains from tobacco.  4) Swallowing: no issues   5) Dental: Encouraged to continue regular followup with dentistry, and dental hygiene including fluoride rinses. He has been compliant. Marland Kitchen   6) Energy: good, TSH a little high today ---> ordered Free T4  7) Social: No active social issues to address at this time   8) f/u in 1 year with Survivorship  specialist at [facility] for final appointment (patient will be at 5 years post tx).   F/u in 6 mo with Dr. Redmond Baseman - Gayleen Orem, RN, our Head and Neck Oncology Navigator will arrange _____________________________________   Eppie Gibson, MD

## 2016-09-01 ENCOUNTER — Ambulatory Visit (HOSPITAL_BASED_OUTPATIENT_CLINIC_OR_DEPARTMENT_OTHER): Payer: Federal, State, Local not specified - PPO | Admitting: Adult Health

## 2016-09-01 ENCOUNTER — Other Ambulatory Visit (HOSPITAL_BASED_OUTPATIENT_CLINIC_OR_DEPARTMENT_OTHER): Payer: Federal, State, Local not specified - PPO

## 2016-09-01 VITALS — BP 119/77 | HR 58 | Temp 98.4°F | Resp 18 | Ht 74.0 in | Wt 204.2 lb

## 2016-09-01 DIAGNOSIS — C01 Malignant neoplasm of base of tongue: Secondary | ICD-10-CM

## 2016-09-01 DIAGNOSIS — R635 Abnormal weight gain: Secondary | ICD-10-CM

## 2016-09-01 LAB — CBC WITH DIFFERENTIAL/PLATELET
BASO%: 0.3 % (ref 0.0–2.0)
Basophils Absolute: 0 10*3/uL (ref 0.0–0.1)
EOS%: 3.6 % (ref 0.0–7.0)
Eosinophils Absolute: 0.1 10*3/uL (ref 0.0–0.5)
HEMATOCRIT: 44 % (ref 38.4–49.9)
HEMOGLOBIN: 14.9 g/dL (ref 13.0–17.1)
LYMPH#: 1.1 10*3/uL (ref 0.9–3.3)
LYMPH%: 27.2 % (ref 14.0–49.0)
MCH: 30.3 pg (ref 27.2–33.4)
MCHC: 33.9 g/dL (ref 32.0–36.0)
MCV: 89.4 fL (ref 79.3–98.0)
MONO#: 0.3 10*3/uL (ref 0.1–0.9)
MONO%: 7.8 % (ref 0.0–14.0)
NEUT%: 61.1 % (ref 39.0–75.0)
NEUTROS ABS: 2.4 10*3/uL (ref 1.5–6.5)
PLATELETS: 211 10*3/uL (ref 140–400)
RBC: 4.92 10*6/uL (ref 4.20–5.82)
RDW: 13.1 % (ref 11.0–14.6)
WBC: 3.9 10*3/uL — AB (ref 4.0–10.3)

## 2016-09-01 LAB — COMPREHENSIVE METABOLIC PANEL
ALK PHOS: 84 U/L (ref 40–150)
ALT: 35 U/L (ref 0–55)
AST: 18 U/L (ref 5–34)
Albumin: 3.8 g/dL (ref 3.5–5.0)
Anion Gap: 10 mEq/L (ref 3–11)
BUN: 9.3 mg/dL (ref 7.0–26.0)
CHLORIDE: 108 meq/L (ref 98–109)
CO2: 23 meq/L (ref 22–29)
Calcium: 9.2 mg/dL (ref 8.4–10.4)
Creatinine: 0.9 mg/dL (ref 0.7–1.3)
GLUCOSE: 89 mg/dL (ref 70–140)
POTASSIUM: 3.9 meq/L (ref 3.5–5.1)
SODIUM: 141 meq/L (ref 136–145)
Total Bilirubin: 0.39 mg/dL (ref 0.20–1.20)
Total Protein: 6.9 g/dL (ref 6.4–8.3)

## 2016-09-01 LAB — TSH: TSH: 3.282 m[IU]/L (ref 0.320–4.118)

## 2016-09-02 LAB — T4, FREE: T4,Free(Direct): 1.13 ng/dL (ref 0.82–1.77)

## 2016-09-02 NOTE — Progress Notes (Signed)
CLINIC:  Survivorship  REASON FOR VISIT:  Routine follow-up for head & neck cancer.   BRIEF ONCOLOGIC HISTORY:  (from Dr. Pearlie Oyster note on 09/02/15)   INTERVAL HISTORY:  [name] reports to the [facility] today for his 5-year visit since completing treatment.  He last saw his ENT physician, Dr. Redmond Baseman, in 04/2016 and was told that everything looked great.  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  He has no swallowing difficulties; denies pain. His appetite is good; denies xerostomia. His energy levels are good.    He is married to his wife of 3 years. 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 His wife works for the [employer] as well.  They a 58-month-old daughter, named [name] who is the joy of their lives.    He has a primary care provider and gets his appropriate screening exams/vaccines.  He feels great.    ADDITIONAL REVIEW OF SYSTEMS:  Review of Systems  Constitutional: Negative.   HENT: Negative.   Eyes: Negative.   Respiratory: Negative.   Cardiovascular: Negative.   Gastrointestinal: Negative.   Genitourinary: Negative.   Musculoskeletal: Negative.   Skin: Negative.   Neurological: Negative.   Endo/Heme/Allergies: Negative.   Psychiatric/Behavioral: Negative.     CURRENT MEDICATIONS:  Current Outpatient Prescriptions on File Prior to Visit  Medication Sig Dispense Refill  . B Complex-C (B-COMPLEX WITH VITAMIN C) tablet Take 1 tablet by mouth daily.    . cetirizine (ZYRTEC) 10 MG tablet Take 10 mg by mouth daily.    . Cholecalciferol 5000 UNITS capsule Take 5,000 Units by mouth daily.    . fish oil-omega-3 fatty acids 1000 MG capsule Take 2 g by mouth daily.     . Ginkgo Biloba (GINKOBA PO) Take by mouth. Ginko Biloba Extract Capsules 30 mg, 2 capsules daily    . Multiple Vitamins-Minerals (MULTIVITAMIN GUMMIES ADULT PO) Take 2 Units by mouth daily.      Marland Kitchen PREVIDENT 5000 DRY MOUTH 1.1 % PSTE      No current facility-administered medications on file prior to visit.     ALLERGIES:  Allergies  Allergen Reactions  . Shellfish Allergy Hives     PHYSICAL EXAM:  Vitals:   09/01/16 1102  BP: 119/77  Pulse: (!) 58  Resp: 18  Temp: 98.4 F (36.9 C)   Filed Weights   09/01/16 1102  Weight: 204 lb 3.2 oz (92.6 kg)    General: Well-nourished, well-appearing male in no acute distress.  Unaccompanied today.  HEENT: Head is  normocephalic.  Pupils equal and reactive to light. Conjunctivae clear without exudate.  Sclerae anicteric. Oral mucosa is pink and moist without lesions.  Tongue pink and moist; mild telangectasia noted to left tongue/posterior oropharynx consistent with post-radiation changes. No palpable masses to tongue, buccal mucosa, or floor of mouth.  Lymph: No cervical, supraclavicular, or infraclavicular lymphadenopathy noted on palpation.   Neck: No palpable masses. Mild anterior neck fibrosis s/p radiation therapy.  Skin on neck is intact. Cardiovascular: Normal rate and rhythm. Respiratory: Clear to auscultation bilaterally. Chest expansion symmetric without accessory muscle use on inspiration or expiration.   GI: Abdomen soft and round. Non-tender, non-distending. Bowel sounds normoactive.   GU: Deferred.   Neuro: No focal deficits. Steady gait.   Psych: Normal mood and affect for situation. Extremities: No edema.   Skin: Warm and dry.    LABORATORY DATA:  CBC    Component Value Date/Time   WBC 3.9 (L) 09/01/2016 1031   WBC 3.0 (L) 08/20/2011 0456   RBC 4.92 09/01/2016 1031   RBC 4.18 (L) 08/20/2011 0456   HGB 14.9 09/01/2016 1031   HCT 44.0 09/01/2016 1031   PLT 211 09/01/2016 1031   MCV 89.4 09/01/2016 1031   MCH 30.3 09/01/2016 1031   MCH 29.4 08/20/2011 0456   MCHC 33.9 09/01/2016 1031   MCHC 33.6 08/20/2011 0456   RDW 13.1 09/01/2016 1031   LYMPHSABS 1.1 09/01/2016 1031   MONOABS 0.3 09/01/2016 1031    EOSABS 0.1 09/01/2016 1031   BASOSABS 0.0 09/01/2016 1031   CMP Latest Ref Rng & Units 09/01/2016 04/08/2013 10/12/2012  Glucose 70 - 140 mg/dl 89 89 86  BUN 7.0 - 26.0 mg/dL 9.3 11.3 12.0  Creatinine 0.7 - 1.3 mg/dL 0.9 0.9 0.9  Sodium 136 - 145 mEq/L 141 141 138  Potassium 3.5 - 5.1 mEq/L 3.9 3.9 4.0  Chloride 98 - 107 mEq/L - 106 107  CO2 22 - 29 mEq/L 23 26 23   Calcium 8.4 - 10.4 mg/dL 9.2 8.9 9.4  Total Protein 6.4 - 8.3 g/dL 6.9 6.6 6.7  Total Bilirubin 0.20 - 1.20 mg/dL 0.39 0.48 0.80  Alkaline Phos 40 - 150 U/L 84 70 88  AST 5 - 34 U/L 18 17 19   ALT 0 - 55 U/L 35 23 23   Results for CONSTANCE, ANCHETA (MRN [id number]) as of 09/01/2016  Ref. Range 09/01/2016 10:31  TSH Latest Ref Range: 0.320 - 4.118 m(IU)/L 3.282  T4,Free(Direct) Latest Ref Range: 0.82 - 1.77 ng/dL 1.13   *Labs reviewed and are largely stable.    DIAGNOSTIC IMAGING:  None at this visit.    ASSESSMENT & PLAN:  Mr. Pavao is a pleasant 55 y.o. 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  Patient presents to survivorship clinic today for routine follow-up and surveillance.   1. Cancer of base of tongue:  Mr. Hirschman is now 5-years out from his diagnosis and treatment of head & neck cancer.  This is extremely favorable in terms of prognosis and risk of recurrence.  He will "graduate" from follow-up here at the cancer center and will continue to be seen by Dr. Redmond Baseman, his ENT physician, as long as he deems clinically necessary.  I wished Mr. Fetterhoff well and encouraged him to reach out me with any questions or concerns if they arise in the future.    2. At risk for hypothyroidism: His TSH and the remainder of his blood work is largely stable today.  Encouraged him to have his PCP continue to check his thyroid function at least once per year, as he will continue to have an increased risk of hypothyroidism after  receiving concurrent chemoradiation for H&N cancer.  He voiced understanding.   3. Dental/Oral health:  Encouraged Mr. Sparger to continue to see his dentist regularly. Patients who are treated with radiation for H&N cancer have an increased risk of oral surgery complications and as a result may need special precautions prior to any oral surgery.   I reminded him that should he ever need extractions or oral surgery in the future that his dentist should have a conversation with Dr. Enrique Sack.    4. Health maintenance and wellness promotion: Mr. Cousin is reportedly up-to-date with his colonoscopy, PSA testing, vaccinations, and annual physicals with  PCP.  Encouraged him to continue to remain physically active and maintain a healthy diet with plenty of fruits and vegetables.     Dispo:  -No further follow-up necessary at the cancer center. He is now 5 years out from treatment.  -Wished him well and encouraged him to call us should any needs/questions arise in the future.  He was given my business card and encouraged to call me anytime.      A total of 20 minutes was spent in the face-to-face care of this patient, with greater than 50% of that time spent in counseling and care-coordination.   Mike Craze, NP [place] [phone]

## 2016-09-09 ENCOUNTER — Encounter: Payer: Self-pay | Admitting: Adult Health

## 2019-07-30 ENCOUNTER — Telehealth: Payer: Self-pay | Admitting: Adult Health

## 2019-07-30 NOTE — Telephone Encounter (Signed)
Faxed medical record for office on 09/01/16 to [facility] @ [phone]

## 2023-11-15 ENCOUNTER — Encounter: Payer: Self-pay | Admitting: Internal Medicine
